# Patient Record
Sex: Male | Born: 1970
Health system: Southern US, Community
[De-identification: ages and names within clinical notes are randomized; demographics above are authoritative.]

## PROBLEM LIST (undated history)

## (undated) DIAGNOSIS — E785 Hyperlipidemia, unspecified: Secondary | ICD-10-CM

## (undated) DIAGNOSIS — E119 Type 2 diabetes mellitus without complications: Secondary | ICD-10-CM

## (undated) DIAGNOSIS — I10 Essential (primary) hypertension: Secondary | ICD-10-CM

## (undated) HISTORY — PX: OTHER SURGICAL HISTORY: SHX169

## (undated) HISTORY — DX: Type 2 diabetes mellitus without complications: E11.9

## (undated) HISTORY — PX: APPENDECTOMY: SHX54

## (undated) HISTORY — DX: Hyperlipidemia, unspecified: E78.5

## (undated) HISTORY — DX: Essential (primary) hypertension: I10

---

## 2008-04-11 ENCOUNTER — Encounter: Admission: RE | Admit: 2008-04-11 | Discharge: 2008-04-11 | Payer: Self-pay | Admitting: Specialist

## 2015-05-08 DIAGNOSIS — K429 Umbilical hernia without obstruction or gangrene: Secondary | ICD-10-CM | POA: Insufficient documentation

## 2015-05-08 DIAGNOSIS — E119 Type 2 diabetes mellitus without complications: Secondary | ICD-10-CM | POA: Insufficient documentation

## 2015-05-08 DIAGNOSIS — E785 Hyperlipidemia, unspecified: Secondary | ICD-10-CM | POA: Insufficient documentation

## 2017-02-04 DIAGNOSIS — M722 Plantar fascial fibromatosis: Secondary | ICD-10-CM | POA: Insufficient documentation

## 2017-02-16 ENCOUNTER — Encounter: Payer: Self-pay | Admitting: Nurse Practitioner

## 2017-02-16 ENCOUNTER — Ambulatory Visit: Payer: Self-pay | Attending: Nurse Practitioner | Admitting: Nurse Practitioner

## 2017-02-16 VITALS — BP 95/64 | HR 103 | Temp 98.2°F | Ht 67.0 in | Wt 220.0 lb

## 2017-02-16 DIAGNOSIS — Z87442 Personal history of urinary calculi: Secondary | ICD-10-CM | POA: Insufficient documentation

## 2017-02-16 DIAGNOSIS — Z7984 Long term (current) use of oral hypoglycemic drugs: Secondary | ICD-10-CM | POA: Insufficient documentation

## 2017-02-16 DIAGNOSIS — Z79899 Other long term (current) drug therapy: Secondary | ICD-10-CM | POA: Insufficient documentation

## 2017-02-16 DIAGNOSIS — I10 Essential (primary) hypertension: Secondary | ICD-10-CM

## 2017-02-16 DIAGNOSIS — E785 Hyperlipidemia, unspecified: Secondary | ICD-10-CM

## 2017-02-16 DIAGNOSIS — M79672 Pain in left foot: Secondary | ICD-10-CM

## 2017-02-16 DIAGNOSIS — E1169 Type 2 diabetes mellitus with other specified complication: Secondary | ICD-10-CM

## 2017-02-16 DIAGNOSIS — Z8249 Family history of ischemic heart disease and other diseases of the circulatory system: Secondary | ICD-10-CM | POA: Insufficient documentation

## 2017-02-16 DIAGNOSIS — Z9889 Other specified postprocedural states: Secondary | ICD-10-CM | POA: Insufficient documentation

## 2017-02-16 DIAGNOSIS — E118 Type 2 diabetes mellitus with unspecified complications: Secondary | ICD-10-CM

## 2017-02-16 DIAGNOSIS — Z833 Family history of diabetes mellitus: Secondary | ICD-10-CM | POA: Insufficient documentation

## 2017-02-16 LAB — GLUCOSE, POCT (MANUAL RESULT ENTRY): POC GLUCOSE: 300 mg/dL — AB (ref 70–99)

## 2017-02-16 MED ORDER — LISINOPRIL 20 MG PO TABS: 20.0000 mg | ORAL_TABLET | Freq: Every day | ORAL | 0 refills | Status: DC

## 2017-02-16 MED ORDER — SIMVASTATIN 40 MG PO TABS: 40.0000 mg | ORAL_TABLET | Freq: Every day | ORAL | 0 refills | Status: DC

## 2017-02-16 MED ORDER — TRUEPLUS LANCETS 28G MISC: 12 refills | Status: DC

## 2017-02-16 MED ORDER — GLIMEPIRIDE 4 MG PO TABS: 4.0000 mg | ORAL_TABLET | Freq: Two times a day (BID) | ORAL | 2 refills | Status: DC

## 2017-02-16 MED ORDER — CANAGLIFLOZIN 100 MG PO TABS: 100.0000 mg | ORAL_TABLET | Freq: Every day | ORAL | 1 refills | Status: AC

## 2017-02-16 MED ORDER — DAPAGLIFLOZIN PROPANEDIOL 5 MG PO TABS: 5.0000 mg | ORAL_TABLET | Freq: Two times a day (BID) | ORAL | 2 refills | Status: DC

## 2017-02-16 MED ORDER — SITAGLIPTIN PHOS-METFORMIN HCL 50-1000 MG PO TABS: 1.0000 | ORAL_TABLET | Freq: Two times a day (BID) | ORAL | 2 refills | Status: DC

## 2017-02-16 MED ORDER — TRUE METRIX METER W/DEVICE KIT: PACK | 0 refills | Status: DC

## 2017-02-16 MED ORDER — GLUCOSE BLOOD VI STRP: ORAL_STRIP | 12 refills | Status: DC

## 2017-02-16 MED FILL — JANUMET 50-1,000 MG TABLET: 50-1000 | 30 days supply | Qty: 60 | Fill #0

## 2017-02-16 MED FILL — TRUEplus LANCETS 28G MISC: 25 days supply | Qty: 100 | Fill #0

## 2017-02-16 MED FILL — LISINOPRIL 20 MG TAB: 20 | 30 days supply | Qty: 30 | Fill #0

## 2017-02-16 MED FILL — !TRUE METRIX BLOOD GLUCOSE: 365 days supply | Qty: 1 | Fill #0

## 2017-02-16 MED FILL — ?GLIMEPIRIDE 4 MG TABLET: 4 | 30 days supply | Qty: 60 | Fill #0

## 2017-02-16 MED FILL — INVOKANA 100 MG TABLET: 100 | 30 days supply | Qty: 30 | Fill #0

## 2017-02-16 MED FILL — SIMVASTATIN 40 MG TABLET: 40 | 30 days supply | Qty: 30 | Fill #0

## 2017-02-16 MED FILL — TRUE METRIX TEST STRIP: 25 days supply | Qty: 100 | Fill #0

## 2017-02-16 NOTE — Patient Instructions (Addendum)
Control del nivel sanguneo de glucosa en los adultos (Blood Glucose Monitoring, Adult) El control del nivel de azcar (glucosa) en la sangre lo ayuda a tener la diabetes bajo control. Tambin ayuda a que usted y el mdico controlen si el tratamiento de la diabetes es Armed forces logistics/support/administrative officer. El control del nivel sanguneo de glucosa implica realizar controles regulares como lo indique el mdico y Catering manager registro de los resultados (registro diario). POR QU DEBO [place] DE GLUCOSA? Si controla su nivel sanguneo de glucosa con regularidad, podr:  Comprender de [employer], la actividad fsica, las enfermedades y los medicamentos inciden en los niveles sanguneos de [place].  Conocer el nivel sanguneo de glucosa en cualquier momento dado. Saber rpidamente si el nivel es bajo (hipoglucemia) o alto (hiperglucemia).  Puede ser de ayuda para que usted y el mdico sepan cmo ajustar los medicamentos. [place] DE GLUCOSA? Siga las indicaciones del mdico acerca de la frecuencia con la que debe controlar el nivel sanguneo de glucosa. La frecuencia puede depender de:  El tipo de diabetes que tenga.  Si su diabetes est bajo control.  Los medicamentos que toma. Si usted tiene diabetes tipo 1:  Controle su nivel sanguneo de glucosa al [employer] al da.  Tambin controle su nivel sanguneo de glucosa: ? Antes de cada inyeccin de insulina. ? Antes y despus de hacer ejercicio. ? [place] comidas. ? Dos horas despus de una comida. ? Ocasionalmente, entre las 2:00a.m. y las 3:00a.m., como se lo hayan indicado. ? Antes de Optometrist tareas peligrosas, English as a second language teacher o usar maquinaria pesada. ? A la hora de acostarse.  Es posible que Geophysicist/field seismologist con ms frecuencia los niveles sanguneos de [place], [place] 6 a 10veces por da: ? Si [place] una bomba de [place]. ? Si necesita varias inyecciones diarias. ? Si su diabetes no est bien  controlada. ? Si est enfermo. ? Si tiene antecedentes de hipoglucemia grave. ? Si tiene antecedentes de no darse cuenta cundo est bajando su nivel sanguneo de glucosa (hipoglucemia asintomtica). Si usted tiene diabetes tipo 2:  Si recibe insulina u otro medicamento para la diabetes, controle el nivel sanguneo de glucosa al [employer] veces al da.  Mientras reciba tratamiento intensivo con insulina, debe medirse el nivel sanguneo de glucosa al menos 4veces al [employer]. Ocasionalmente, es posible que deba controlarse entre las 2:00a.m. y las 3:00a.m., segn se lo indiquen.  Tambin controle su nivel sanguneo de glucosa: ? Antes y despus de hacer ejercicio. ? Antes de Optometrist tareas peligrosas, English as a second language teacher o usar maquinaria pesada.  Es posible que Geophysicist/field seismologist con ms frecuencia los niveles sanguneos de glucosa si: ? Es necesario ajustar la dosis de sus medicamentos. ? Su diabetes no est bien controlada. ? Est enfermo. QU ES UN REGISTRO [place] DE GLUCOSA?  Un registro diario es un registro de los valores de glucosa en la [place]. Puede ayudarles a usted y a su mdico a: ? Health visitor en su nivel sanguneo de glucosa durante el transcurso del [place]. ? Ajustar su plan de control de la diabetes como sea necesario.  Cada vez que controle su nivel sanguneo de glucosa, anote el resultado y aquellos factores que pueden estar afectando su nivel sanguneo de glucosa, como la dieta y la actividad fsica Designer, multimedia.  La [employer] de los medidores de glucosa guardan un registro de las lecturas realizadas con el medidor. Algunos permiten descargar sus registros en  una computadora.  CMO ME CONTROLO EL NIVEL SANGUNEO DE GLUCOSA? Siga los siguientes pasos para obtener lecturas precisas de su glucemia: Materiales necesarios  Medidor de Horticulturist, commercial.  Tiras reactivas para el medidor. Cada medidor tiene sus propias tiras reactivas. Debe usar  las tiras reactivas que trae su medidor.  Una aguja para pincharse el dedo (lanceta). No utilice la misma lanceta en ms de una ocasin.  Un dispositivo que sujeta la lanceta (dispositivo de puncin).  Un diario o libro de anotaciones para [employer]. Procedimiento  [employer] con agua y [place].  Pnchese el costado del dedo (no la punta) con Optometrist. Use un dedo diferente cada vez.  Frote suavemente el dedo [employer] aparezca una pequea gota de [place].  Siga las instrucciones que vienen con el medidor para Public affairs consultant tira Firefighter, Contractor la sangre sobre la tira y usar el medidor de Horticulturist, commercial.  Registre el resultado y las observaciones que desee. Zonas del cuerpo alternativas para realizar las pruebas  Algunos medidores le permiten tomar sangre para la prueba de otras zonas del cuerpo que no son el dedo (zonas alternativas).  Si cree que tiene hipoglucemia o si tiene hipoglucemia asintomtica, no utilice las zonas alternativas del cuerpo. En su lugar, use los dedos.  Es posible que las zonas alternativas no sean tan precisas como los dedos porque el flujo de sangre es ms lento en esas zonas. Esto significa que el resultado que obtiene de estas zonas puede estar retrasado y ser un poco diferente del resultado que obtendra del dedo.  Los sitios alternativos ms comunes son los siguientes: ? Los antebrazos. ? Los muslos. ? La palma de [employer]. Consejos adicionales  Siempre tenga los insumos a mano.  Todos los medidores de glucosa incluyen un nmero de telfono "directo", disponible las 24 horas, al que podr llamar si tiene preguntas o [place]. Tambin puede consultar a su mdico.  Despus de usar algunas cajas de tiras reactivas, ajuste (calibre) el medidor de glucemia segn las instrucciones del medidor. Esta informacin no tiene Theme park manager el consejo del mdico. Asegrese de hacerle al mdico cualquier pregunta que  tenga. Document Released: 01/26/2005 Document Revised: 05/20/2015 Document Reviewed: 07/08/2015 Elsevier Interactive Patient Education  2017 [employer].  Dolor del pie (Foot Pain) El dolor del pie puede tener muchas causas. Algunas causas frecuentes son las siguientes:  Una lesin.  Un esguince.  Artritis.  Ampollas.  Juanetes. INSTRUCCIONES PARA EL CUIDADO EN EL HOGAR Est atento a cualquier cambio en los sntomas. Tome estas medidas para aliviar las molestias:  Si se lo indican, aplique hielo sobre la zona afectada: ? Ponga el hielo en una bolsa plstica. ? Coloque una [employer] piel y la bolsa de hielo. ? Deje el hielo durante 15 a 20 minutos, 3 a 4veces por da, durante 2das.  Tome los medicamentos de venta libre y los recetados solamente como se lo haya indicado el mdico.  Use zapatos cmodos y anatmicos que tengan buen calce. No use zapatos con tacones altos.  No permanezca de pie ni camine durante largos perodos.  No levante mucho peso. Esto puede agregar presin en el pie.  Haga ejercicios de estiramiento para Engineer, materials del pie y la rigidez como se lo haya indicado el mdico.  Hgase masajes suaves en el pie.  Mantenga los pies, limpios y secos. SOLICITE ATENCIN MDICA SI:  El dolor no mejora despus de [address] de  cuidados personales.  El dolor [place].  No puede apoyar el peso en el pie. SOLICITE ATENCIN MDICA DE INMEDIATO SI:  El pie se le adormece o tiene hormigueo.  El pie o los dedos de ese pie se le hinchan.  El pie o los dedos de ese pie se tornan de color blanco o [place].  Tiene el pie enrojecido y caliente al tacto. Esta informacin no tiene Theme park manager el consejo del mdico. Asegrese de hacerle al mdico cualquier pregunta que tenga. Document Released: 05/20/2015 Document Revised: 05/20/2015 Document Reviewed: 02/21/2014 Elsevier Interactive Patient Education  2018 [employer].  Espoln calcneo (Heel  [place]) Los espolones calcneos son protuberancias seas que se forman en la parte inferior del hueso del taln (calcneo). Son frecuentes y no siempre Customer service manager. Sin embargo, a menudo Photographer en la banda de tejido resistente que se extiende por debajo del hueso del pie (fascia plantar). Cuando esto sucede, puede sentir [employer] parte inferior del pie, cerca del taln. CAUSAS No se conoce con exactitud la causa de los espolones calcneos. Pueden deberse a la presin ejercida sobre el taln, o bien originarse en las inserciones musculares (tendones) cercanas que traccionan en el taln. FACTORES DE RIESGO Puede estar en riesgo de tener un espoln calcneo si:  Tiene ms de 40 aos.  Tiene sobrepeso.  Padece artritis por uso y desgaste (artrosis).  Presenta inflamacin en la fascia plantar. SIGNOS Y SNTOMAS Algunas personas tienen espolones calcneos aunque no presentan sntomas. Si tiene sntomas, estos pueden incluir los siguientes:  Dolor en la parte inferior del taln.  Dolor que empeora al levantarse de la cama por primera vez.  Dolor que empeora despus de caminar o ponerse de pie. DIAGNSTICO El mdico podr hacer el diagnstico del espoln calcneo en funcin de sus sntomas y del examen fsico. Tambin es posible que le realicen una radiografa del pie para detectar la presencia de una protuberancia sea que proviene del calcneo. TRATAMIENTO El tratamiento pretende aliviar el dolor del espoln calcneo. Este puede incluir lo siguiente:  Ejercicios de Film/video editor.  Bajar de [place].  Usar calzado, plantillas o aparatos ortopdicos especficos para mayor comodidad y apoyo.  Usar frulas a la noche para Secretary/administrator en una posicin apropiada.  Tomar medicamentos de venta libre para Engineer, materials.  Recibir tratamiento con ondas sonoras de alta intensidad para fragmentar el espoln (terapia extracorprea por ondas de choque).  Recibir inyecciones de  corticoides en el taln para reducir la hinchazn y Engineer, materials.  Someterse a Transport planner calcneo causa dolor a Air cabin crew (crnico). INSTRUCCIONES PARA EL CUIDADO EN EL HOGAR  Tome los medicamentos solamente como se lo haya indicado el mdico.  Pregunte a su mdico si debe aplicar hielo o compresas fras en las zonas dolorosas del taln o del pie.  Evite las actividades que puedan causarle dolor hasta que se recupere o como se lo haya indicado el mdico.  [place] antes de hacer ejercicio o actividad fsica.  Use zapatos con un buen apoyo que le calcen bien como le haya indicado su mdico. Es posible que necesite comprar zapatos nuevos. Si [place] zapatos viejos o que no le calzan bien no obtendr el apoyo que necesita.  Baje de peso si su mdico considera que debera [place]. Esto podra aliviar la presin en el pie que puede estar causando el dolor y las [place]. SOLICITE ATENCIN MDICA SI:  El dolor contina o empeora. Esta informacin no tiene como fin  reemplazar el consejo del mdico. Asegrese de hacerle al mdico cualquier pregunta que tenga. Document Released: 11/12/2005 Document Revised: 02/16/2014 Document Reviewed: 03/29/2013 Elsevier Interactive Patient Education  [employer]2018 Elsevier Inc.

## 2017-02-16 NOTE — Progress Notes (Signed)
Assessment & Plan:  Lance Howell was seen today for new patient (initial visit).  Diagnoses and all orders for this visit:  Essential hypertension -     lisinopril (PRINIVIL,ZESTRIL) 20 MG tablet; Take 1 tablet (20 mg total) by mouth daily. Continue all antihypertensives as prescribed.  Remember to bring in your blood pressure log with you for your follow up appointment.  DASH/Mediterranean Diets are healthier choices for HTN.    Pain of left heel -     Ambulatory referral to Podiatry Ace wrap foot Apply ice to heel for pain May alternate ibuprofen and acetominophen as instructed for pain Rest foot as needed  Controlled type 2 diabetes mellitus with complication, without long-term current use of insulin (HCC) -     Glucose (CBG) -     sitaGLIPtin-metformin (JANUMET) 50-1000 MG tablet; Take 1 tablet by mouth 2 (two) times daily with a meal. -     glimepiride (AMARYL) 4 MG tablet; Take 1 tablet (4 mg total) by mouth 2 (two) times daily. -     glucose blood test strip; Use as instructed -     Blood Glucose Monitoring Suppl (TRUE METRIX METER) w/Device KIT; Use as instructed. -     TRUEPLUS LANCETS 28G MISC; Use as instructed -     canagliflozin (INVOKANA) 100 MG TABS tablet; Take 1 tablet (100 mg total) by mouth daily Advised patient to keep a fasting blood sugar log fast, 2 hours post lunch and bedtime which will be reviewed at the next office visit.  Hyperlipidemia associated with type 2 diabetes mellitus (HCC) -     simvastatin (ZOCOR) 40 MG tablet; Take 1 tablet (40 mg total) by mouth daily. Work on a low fat, heart healthy diet and participate in regular aerobic exercise program to control as well.    Patient has been counseled on age-appropriate routine health concerns for screening and prevention. These are reviewed and up-to-date. Referrals have been placed accordingly. Immunizations are up-to-date or declined.    Subjective:   Chief Complaint  Patient presents with  . New  Patient (Initial Visit)    Patient stated he is here to establish care for his diabetes. Patient stated he have samples of his medication and would like to get them refills.    HPI  VRI was used to communicate directly with patient for the entire encounter including providing detailed patient instructions.  Lance Howell 47 y.o. male presents to office today to establish care as a new patient.   DM TYPE 2 Diagnosed 7-8 years ago. Checking fasting blood sugars once every morning. Lowest 86 Highest reading 201. He has his glucometer with him today. He denies any hypo of hyper glycemic symptoms. He is currently taking farxiga; will switch to invokana due to cost.    Left Heel Pain Onset 3 weeks ago. He denies any injury or trauma to left foot. Aggravating factors; Initial standing. Relieving factors: pain goes away after a few minutes of walking or standing. He has not tried anything for the pain.   Essential Hypertension Takes Lisinopril for HTN. He has a blood pressure monitor at home but reports it will only read as high low or normal. It will not give actual numbers. States his readings always say normal. Today his blood pressure is on the low end of normal. He is asymptomatic. May need to decrease lisinopril. Will continue to monitor. Denies chest pain, shortness of breath, palpitations, lightheadedness, dizziness, headaches or BLE edema.  BP Readings from Last  3 Encounters:  02/16/17 95/64     Review of Systems  Constitutional: Negative for fever, malaise/fatigue and weight loss.  HENT: Negative.  Negative for nosebleeds.   Eyes: Negative.  Negative for blurred vision, double vision and photophobia.  Respiratory: Negative.  Negative for cough and shortness of breath.   Cardiovascular: Negative.  Negative for chest pain, palpitations and leg swelling.  Gastrointestinal: Negative.  Negative for abdominal pain, constipation, diarrhea, heartburn, nausea and vomiting.  Musculoskeletal:  Negative.  Negative for myalgias.       Left heel pain  Neurological: Negative.  Negative for dizziness, focal weakness, seizures and headaches.  Endo/Heme/Allergies: Negative for environmental allergies.  Psychiatric/Behavioral: Negative.  Negative for suicidal ideas.    Past Medical History:  Diagnosis Date  . Diabetes mellitus without complication [facility])     Past Surgical History:  Procedure Laterality Date  . APPENDECTOMY    . kidney stone removal      Family History  Problem Relation Age of Onset  . Hypertension Mother   . Diabetes Father     Social History Reviewed with no changes to be made today.   Outpatient Medications Prior to Visit  Medication Sig Dispense Refill  . dapagliflozin propanediol (FARXIGA) 5 MG TABS tablet Take 5 mg by mouth 2 (two) times daily.    Marland Kitchen glimepiride (AMARYL) 4 MG tablet Take 4 mg by mouth 2 (two) times daily.    Marland Kitchen lisinopril (PRINIVIL,ZESTRIL) 20 MG tablet Take 20 mg by mouth daily.    . simvastatin (ZOCOR) 40 MG tablet Take 40 mg by mouth daily.    . sitaGLIPtin-metformin (JANUMET) 50-1000 MG tablet Take 1 tablet by mouth 2 (two) times daily with a meal.     No facility-administered medications prior to visit.     Not on File     Objective:    BP 95/64 (BP Location: Left Arm, Patient Position: Sitting, Cuff Size: Normal)   Pulse (!) 103   Temp 98.2 F (36.8 C) (Oral)   Ht 5' 7"  (1.702 m)   Wt 220 lb (99.8 kg)   SpO2 95%   BMI 34.46 kg/m  Wt Readings from Last 3 Encounters:  02/16/17 220 lb (99.8 kg)    Physical Exam  Constitutional: He is oriented to person, place, and time. He appears well-developed and well-nourished. He is cooperative.  HENT:  Head: Normocephalic and atraumatic.  Eyes: EOM are normal.  Neck: Normal range of motion.  Cardiovascular: Normal rate, regular rhythm, normal heart sounds and intact distal pulses. Exam reveals no gallop and no friction rub.  No murmur heard. Pulmonary/Chest: Effort normal  and breath sounds normal. No tachypnea. No respiratory distress. He has no decreased breath sounds. He has no wheezes. He has no rhonchi. He has no rales. He exhibits no tenderness.  Abdominal: Soft. Bowel sounds are normal.  Musculoskeletal: Normal range of motion. He exhibits no edema.       Left foot: There is tenderness.       Feet:  Neurological: He is alert and oriented to person, place, and time. Coordination normal.  Skin: Skin is warm and dry.  Psychiatric: He has a normal mood and affect. His behavior is normal. Judgment and thought content normal.  Nursing note and vitals reviewed.      Patient has been counseled extensively about nutrition and exercise as well as the importance of adherence with medications and regular follow-up. The patient was given clear instructions to go to ER or return to  medical center if symptoms don't improve, worsen or new problems develop. The patient verbalized understanding.   Follow-up: Return in about 2 months (around 04/19/2017) for Needs appointment with financial representative., HTN/HPL/DM.   Gildardo Pounds, FNP-BC [facility] and [place], [place]   02/16/2017, 10:41 PM

## 2017-02-19 ENCOUNTER — Ambulatory Visit: Payer: Self-pay | Attending: Nurse Practitioner

## 2017-03-22 ENCOUNTER — Ambulatory Visit: Payer: Self-pay | Attending: Nurse Practitioner

## 2017-03-22 MED FILL — ?SIMVASTATIN 40 MG TABS: 40 | 30 days supply | Qty: 30 | Fill #1

## 2017-03-22 MED FILL — ?GLIMEPIRIDE 4 MG TABLET: 4 | 30 days supply | Qty: 60 | Fill #1

## 2017-03-22 MED FILL — !JANUMET 50-1000MG TABLET: 50-1000 | 30 days supply | Qty: 60 | Fill #1

## 2017-03-22 MED FILL — INVOKANA 100 MG TABLET: 100 | 30 days supply | Qty: 30 | Fill #1

## 2017-03-22 MED FILL — TRUE METRIX TEST STRIP: 25 days supply | Qty: 100 | Fill #1

## 2017-03-22 MED FILL — TRUEplus LANCETS 28G MISC: 25 days supply | Qty: 100 | Fill #1

## 2017-03-22 MED FILL — LISINOPRIL 20 MG TAB: 20 | 30 days supply | Qty: 30 | Fill #1

## 2017-04-02 ENCOUNTER — Other Ambulatory Visit: Payer: Self-pay | Admitting: Nurse Practitioner

## 2017-04-02 ENCOUNTER — Ambulatory Visit: Payer: Self-pay | Attending: Nurse Practitioner

## 2017-04-02 DIAGNOSIS — Z0000 Encounter for general adult medical examination without abnormal findings: Secondary | ICD-10-CM | POA: Insufficient documentation

## 2017-04-02 NOTE — Progress Notes (Signed)
Patient here for lab visit only 

## 2017-04-03 LAB — LIPID PANEL
Chol/HDL Ratio: 3.1 ratio (ref 0.0–5.0)
Cholesterol, Total: 159 mg/dL (ref 100–199)
HDL: 52 mg/dL (ref >39)
LDL Calculated: 80 mg/dL (ref 0–99)
Triglycerides: 134 mg/dL (ref 0–149)
VLDL Cholesterol Cal: 27 mg/dL (ref 5–40)

## 2017-04-03 LAB — BASIC METABOLIC PANEL
BUN/Creatinine Ratio: 18 (ref 9–20)
BUN: 18 mg/dL (ref 6–24)
CALCIUM: 9.4 mg/dL (ref 8.7–10.2)
CHLORIDE: 101 mmol/L (ref 96–106)
CO2: 24 mmol/L (ref 20–29)
CREATININE: 1.02 mg/dL (ref 0.76–1.27)
GFR, EST AFRICAN AMERICAN: 102 mL/min/{1.73_m2} (ref >59)
GFR, EST NON AFRICAN AMERICAN: 88 mL/min/{1.73_m2} (ref >59)
Glucose: 135 mg/dL — ABNORMAL HIGH (ref 65–99)
Potassium: 4.8 mmol/L (ref 3.5–5.2)
Sodium: 143 mmol/L (ref 134–144)

## 2017-04-03 LAB — CBC
HEMOGLOBIN: 17.2 g/dL (ref 13.0–17.7)
Hematocrit: 50.9 % (ref 37.5–51.0)
MCH: 29.3 pg (ref 26.6–33.0)
MCHC: 33.8 g/dL (ref 31.5–35.7)
MCV: 87 fL (ref 79–97)
Platelets: 267 10*3/uL (ref 150–379)
RBC: 5.87 x10E6/uL — AB (ref 4.14–5.80)
RDW: 14 % (ref 12.3–15.4)
WBC: 10.9 10*3/uL — AB (ref 3.4–10.8)

## 2017-04-03 LAB — HEMOGLOBIN A1C
ESTIMATED AVERAGE GLUCOSE: 169 mg/dL
HEMOGLOBIN A1C: 7.5 % — AB (ref 4.8–5.6)

## 2017-04-13 ENCOUNTER — Telehealth: Payer: Self-pay

## 2017-04-13 NOTE — Telephone Encounter (Signed)
CMA called patient to inform on lab results and PCP advising.   Patient understood. No questions.  Spanish Interpreter Angelina PihJoanni [phone] assist with the call.

## 2017-04-13 NOTE — Telephone Encounter (Signed)
-----   Message from Claiborne RiggZelda W Fleming, NP sent at 04/11/2017  7:10 PM EST ----- A1c is 7.5 down from 7.8 when your a1c was 7.8  The goal is less than 7. Cholesterol levels are normal.. Work on a low fat, heart healthy diet and participate in regular aerobic exercise program to control as well by working out at least 150 minutes per week. No fried foods. No junk foods, sodas, sugary drinks, unhealthy snacking, or smoking.

## 2017-04-16 ENCOUNTER — Ambulatory Visit: Payer: Self-pay | Admitting: Nurse Practitioner

## 2017-04-21 ENCOUNTER — Ambulatory Visit: Payer: Self-pay | Admitting: Nurse Practitioner

## 2017-05-05 ENCOUNTER — Ambulatory Visit: Payer: Self-pay | Attending: Nurse Practitioner | Admitting: Nurse Practitioner

## 2017-05-05 ENCOUNTER — Encounter: Payer: Self-pay | Admitting: Nurse Practitioner

## 2017-05-05 VITALS — BP 120/85 | HR 81 | Temp 98.2°F | Ht 67.0 in | Wt 221.8 lb

## 2017-05-05 DIAGNOSIS — E1169 Type 2 diabetes mellitus with other specified complication: Secondary | ICD-10-CM | POA: Insufficient documentation

## 2017-05-05 DIAGNOSIS — K429 Umbilical hernia without obstruction or gangrene: Secondary | ICD-10-CM | POA: Insufficient documentation

## 2017-05-05 DIAGNOSIS — E1165 Type 2 diabetes mellitus with hyperglycemia: Secondary | ICD-10-CM | POA: Insufficient documentation

## 2017-05-05 DIAGNOSIS — E785 Hyperlipidemia, unspecified: Secondary | ICD-10-CM

## 2017-05-05 DIAGNOSIS — Z7984 Long term (current) use of oral hypoglycemic drugs: Secondary | ICD-10-CM | POA: Insufficient documentation

## 2017-05-05 DIAGNOSIS — E11649 Type 2 diabetes mellitus with hypoglycemia without coma: Secondary | ICD-10-CM | POA: Insufficient documentation

## 2017-05-05 DIAGNOSIS — Z79899 Other long term (current) drug therapy: Secondary | ICD-10-CM | POA: Insufficient documentation

## 2017-05-05 DIAGNOSIS — Z09 Encounter for follow-up examination after completed treatment for conditions other than malignant neoplasm: Secondary | ICD-10-CM | POA: Insufficient documentation

## 2017-05-05 DIAGNOSIS — Z833 Family history of diabetes mellitus: Secondary | ICD-10-CM | POA: Insufficient documentation

## 2017-05-05 DIAGNOSIS — E118 Type 2 diabetes mellitus with unspecified complications: Secondary | ICD-10-CM

## 2017-05-05 DIAGNOSIS — Z9889 Other specified postprocedural states: Secondary | ICD-10-CM | POA: Insufficient documentation

## 2017-05-05 DIAGNOSIS — Z760 Encounter for issue of repeat prescription: Secondary | ICD-10-CM | POA: Insufficient documentation

## 2017-05-05 DIAGNOSIS — I10 Essential (primary) hypertension: Secondary | ICD-10-CM | POA: Insufficient documentation

## 2017-05-05 DIAGNOSIS — Z8249 Family history of ischemic heart disease and other diseases of the circulatory system: Secondary | ICD-10-CM | POA: Insufficient documentation

## 2017-05-05 DIAGNOSIS — K6289 Other specified diseases of anus and rectum: Secondary | ICD-10-CM | POA: Insufficient documentation

## 2017-05-05 LAB — GLUCOSE, POCT (MANUAL RESULT ENTRY): POC Glucose: 173 mg/dl — AB (ref 70–99)

## 2017-05-05 MED ORDER — GLIMEPIRIDE 4 MG PO TABS: 4.0000 mg | ORAL_TABLET | Freq: Every day | ORAL | 1 refills | Status: DC

## 2017-05-05 MED ORDER — CANAGLIFLOZIN 100 MG PO TABS: 100.0000 mg | ORAL_TABLET | Freq: Every day | ORAL | 1 refills | Status: DC

## 2017-05-05 MED ORDER — SIMVASTATIN 40 MG PO TABS: 40.0000 mg | ORAL_TABLET | Freq: Every day | ORAL | 1 refills | Status: DC

## 2017-05-05 MED ORDER — SITAGLIPTIN PHOS-METFORMIN HCL 50-1000 MG PO TABS: 1.0000 | ORAL_TABLET | Freq: Two times a day (BID) | ORAL | 6 refills | Status: DC

## 2017-05-05 MED ORDER — LISINOPRIL 20 MG PO TABS: 20.0000 mg | ORAL_TABLET | Freq: Every day | ORAL | 1 refills | Status: DC

## 2017-05-05 MED ORDER — HYDROCORTISONE ACE-PRAMOXINE 1-1 % RE CREA: 1.0000 "application " | TOPICAL_CREAM | Freq: Two times a day (BID) | RECTAL | 0 refills | Status: DC

## 2017-05-05 MED FILL — HYDROCORT-PRAMOXINE 1%-1% C: 1-1 | 15 days supply | Qty: 30 | Fill #0

## 2017-05-05 MED FILL — ?GLIMEPIRIDE 4 MG TABLET: 4 | 30 days supply | Qty: 30 | Fill #0

## 2017-05-05 MED FILL — INVOKANA 100 MG TABLET: 100 | 30 days supply | Qty: 30 | Fill #0

## 2017-05-05 MED FILL — LISINOPRIL 20 MG TAB: 20 | 30 days supply | Qty: 30 | Fill #0

## 2017-05-05 MED FILL — ?SIMVASTATIN 40 MG TABS: 40 | 30 days supply | Qty: 30 | Fill #0

## 2017-05-05 MED FILL — !JANUMET 50-1000MG TABLET: 50-1000 | 30 days supply | Qty: 60 | Fill #0

## 2017-05-05 NOTE — Progress Notes (Signed)
Assessment & Plan:  Lance Howell was seen today for follow-up and medication refill.  Diagnoses and all orders for this visit:  Controlled type 2 diabetes mellitus with complication, without long-term current use of insulin (HCC) -     Glucose (CBG) -     canagliflozin (INVOKANA) 100 MG TABS tablet; Take 1 tablet (100 mg total) by mouth daily before breakfast. -     glimepiride (AMARYL) 4 MG tablet; Take 1 tablet (4 mg total) by mouth daily with breakfast. -     sitaGLIPtin-metformin (JANUMET) 50-1000 MG tablet; Take 1 tablet by mouth 2 (two) times daily with a meal.  Continue blood sugar control as discussed in office today, low carbohydrate diet, and regular physical exercise as tolerated, 150 minutes per week (30 min each day, 5 days per week, or 50 min 3 days per week). Keep blood sugar logs with fasting goal of 80-130 mg/dl, post prandial less than 180.  For Hypoglycemia: BS <60 and Hyperglycemia BS >400; contact the clinic ASAP. Annual eye exams and foot exams are recommended.   Essential hypertension -     lisinopril (PRINIVIL,ZESTRIL) 20 MG tablet; Take 1 tablet (20 mg total) by mouth daily. Continue all antihypertensives as prescribed.  Remember to bring in your blood pressure log with you for your follow up appointment.  DASH/Mediterranean Diets are healthier choices for HTN.   Hyperlipidemia associated with type 2 diabetes mellitus (HCC) -     simvastatin (ZOCOR) 40 MG tablet; Take 1 tablet (40 mg total) by mouth daily. INSTRUCTIONS: Work on a low fat, heart healthy diet and participate in regular aerobic exercise program to control as well by working out at least 150 minutes per week. No fried foods. No junk foods, sodas, sugary drinks, unhealthy snacking, or smoking.   Umbilical hernia without obstruction and without gangrene -     US Abdomen Complete; Future  Rectal pain -     pramoxine-hydrocortisone (PROCTOCREAM-HC) 1-1 % rectal cream; Place 1 application rectally 2 (two)  times daily.    Patient has been counseled on age-appropriate routine health concerns for screening and prevention. These are reviewed and up-to-date. Referrals have been placed accordingly. Immunizations are up-to-date or declined.    Subjective:   Chief Complaint  Patient presents with  . Follow-up    Pt is here for a follow-up on Hypertension, cholesterol, and diabetes.   . Medication Refill    Pt. stated he need medication refills. He tried to get refill by the pharmacy through pass elgible but he said the Provider need to authorize it.   HPI Lance Howell 47 y.o. male presents to office today for follow up. VRI was used to communicate directly with patient for the entire encounter including providing detailed patient instructions.   DM TYPE 2 He recently ran out of his Invokana.  Currently taking glimepiride 4 mg daily and Janumet 50-1000 mg BID. Checking his blood sugar twice a day and he has his glucose log with him as well.  Seven-and Fourteen day average 130s; 30-day average 120s.  He denies any symptoms of hypo-or hyperglycemia.  He is overdue for an eye exam.    Lab Results  Component Value Date   HGBA1C 7.5 (H) 04/02/2017    Essential Hypertension He is medically compliant taking lisinopril 20 g daily.  Blood pressure is well controlled. Denies chest pain, shortness of breath, palpitations, lightheadedness, dizziness, headaches or BLE edema.  BP Readings from Last 3 Encounters:  05/05/17 120/85  02/16/17 95/64  Hyperlipidemia Patient presents for follow up to hyperlipidemia.  LDL at goal.  He is not medication compliant. He is diet compliant and denies chest pain, exertional chest pressure/discomfort and lower extremity edema or statin intolerance including myalgias.  Lab Results  Component Value Date   CHOL 159 04/02/2017   Lab Results  Component Value Date   HDL 52 04/02/2017   Lab Results  Component Value Date   LDLCALC 80 04/02/2017   Lab Results    Component Value Date   TRIG 134 04/02/2017   Lab Results  Component Value Date   CHOLHDL 3.1 04/02/2017    Review of Systems  Constitutional: Negative for fever, malaise/fatigue and weight loss.  HENT: Negative.  Negative for nosebleeds.   Eyes: Negative.  Negative for blurred vision, double vision and photophobia.  Respiratory: Negative.  Negative for cough and shortness of breath.   Cardiovascular: Negative.  Negative for chest pain, palpitations and leg swelling.  Gastrointestinal: Positive for abdominal pain and constipation. Negative for blood in stool, heartburn, melena, nausea and vomiting.       Hemorrhoids  Musculoskeletal: Negative.  Negative for myalgias.  Neurological: Negative.  Negative for dizziness, focal weakness, seizures and headaches.  Psychiatric/Behavioral: Negative.  Negative for suicidal ideas.    Past Medical History:  Diagnosis Date  . Diabetes mellitus without complication [facility])     Past Surgical History:  Procedure Laterality Date  . APPENDECTOMY    . kidney stone removal      Family History  Problem Relation Age of Onset  . Hypertension Mother   . Diabetes Father     Social History Reviewed with no changes to be made today.   Outpatient Medications Prior to Visit  Medication Sig Dispense Refill  . Blood Glucose Monitoring Suppl (TRUE METRIX METER) w/Device KIT Use as instructed. 1 kit 0  . glucose blood test strip Use as instructed 100 each 12  . TRUEPLUS LANCETS 28G MISC Use as instructed 100 each 12  . canagliflozin (INVOKANA) 100 MG TABS tablet Take by mouth daily before breakfast.    . glimepiride (AMARYL) 4 MG tablet Take 1 tablet (4 mg total) by mouth 2 (two) times daily. 60 tablet 2  . lisinopril (PRINIVIL,ZESTRIL) 20 MG tablet Take 1 tablet (20 mg total) by mouth daily. 90 tablet 0  . simvastatin (ZOCOR) 40 MG tablet Take 1 tablet (40 mg total) by mouth daily. 90 tablet 0  . sitaGLIPtin-metformin (JANUMET) 50-1000 MG tablet Take  1 tablet by mouth 2 (two) times daily with a meal. 60 tablet 2   No facility-administered medications prior to visit.     Not on File     Objective:    BP 120/85 (BP Location: Left Arm, Patient Position: Sitting, Cuff Size: Normal)   Pulse 81   Temp 98.2 F (36.8 C) (Oral)   Ht _0  (1.702 m)   Wt 221 lb 12.8 oz (100.6 kg)   SpO2 95%   BMI 34.74 kg/m  Wt Readings from Last 3 Encounters:  05/05/17 221 lb 12.8 oz (100.6 kg)  02/16/17 220 lb (99.8 kg)    Physical Exam  Constitutional: He is oriented to person, place, and time. He appears well-developed and well-nourished. He is cooperative.  HENT:  Head: Normocephalic and atraumatic.  Eyes: EOM are normal.  Neck: Normal range of motion.  Cardiovascular: Normal rate, regular rhythm and normal heart sounds. Exam reveals no gallop and no friction rub.  No murmur heard. Pulmonary/Chest: Effort normal and  breath sounds normal. No tachypnea. No respiratory distress. He has no decreased breath sounds. He has no wheezes. He has no rhonchi. He has no rales. He exhibits no tenderness.  Abdominal: Soft. Bowel sounds are normal. He exhibits mass. There is tenderness in the periumbilical area. A hernia is present. Hernia confirmed positive in the ventral area.    Musculoskeletal: Normal range of motion. He exhibits no edema.  Neurological: He is alert and oriented to person, place, and time. Coordination normal.  Skin: Skin is warm and dry.  Psychiatric: He has a normal mood and affect. His behavior is normal. Judgment and thought content normal.  Nursing note and vitals reviewed.      Patient has been counseled extensively about nutrition and exercise as well as the importance of adherence with medications and regular follow-up. The patient was given clear instructions to go to ER or return to medical center if symptoms don't improve, worsen or new problems develop. The patient verbalized understanding.   Follow-up: Return in about 3  months (around 08/05/2017) for DM.   Gildardo Pounds, FNP-BC [facility] [place], [place]   05/05/2017, 8:10 PM

## 2017-05-05 NOTE — Patient Instructions (Signed)
Hemorroides (Hemorrhoids) Las hemorroides son venas inflamadas adentro o alrededor del recto o del ano. Las hemorroides pueden causar dolor, picazn o hemorragias. Generalmente no causan problemas graves. Con frecuencia mejoran al cambiar la dieta, el estilo de vida y otros tratamientos en el hogar. CUIDADOS EN EL HOGAR Comida y bebida  Consuma alimentos que contengan fibra, como cereales integrales, porotos, frutos secos, frutas y verduras. Pregntele a su mdico acerca de tomar productos con fibra aadida en ellos (complementos defibra).  Beba suficiente lquido para mantener el pis (orina) claro o de color amarillo plido. En caso de dolor e hinchazn  Tome un bao de agua tibia (bao de asiento) durante 20 minutos para aliviar el dolor. Hgalo 3 o 4veces al da.  Si se lo indican, aplique hielo sobre la zona adolorida. Puede ser beneficioso aplicar hielo entre los baos con agua tibia. ? Ponga el hielo en una bolsa plstica. ? Coloque una toalla entre la piel y la bolsa de hielo. ? Coloque el hielo durante 20 minutos, 2 a 3 veces por da. Instrucciones generales  Tome los medicamentos de venta libre y los recetados solamente como se lo haya indicado el mdico. ? Las cremas y medicamentos recetados que se colocan en el ano (supositorios) se pueden usar o aplicar como se lo hayan indicado.  Haga ejercicio fsico con frecuencia.  Vaya al bao cuando sienta la necesidad de defecar. No espere.  Evite hacer demasiada fuerza al defecar.  Mantenga la zona del ano limpia y seca. Use papel higinico hmedo o toallas de papel hmedas.  No pase mucho tiempo sentado en el inodoro. SOLICITE AYUDA SI:  Tiene alguno de estos sntomas: ? Dolor e hinchazn que no mejoran con el tratamiento o los medicamentos. ? Hemorragia que no se detiene. ? Dificultad para defecar o imposibilidad de hacerlo. ? Dolor o hinchazn en la zona exterior de las hemorroides. Esta informacin no tiene como fin  reemplazar el consejo del mdico. Asegrese de hacerle al mdico cualquier pregunta que tenga. Document Released: 05/23/2012 Document Revised: 05/20/2015 Document Reviewed: 10/10/2014 Elsevier Interactive Patient Education  2018 Elsevier Inc.  

## 2017-05-10 ENCOUNTER — Ambulatory Visit (HOSPITAL_COMMUNITY)
Admission: RE | Admit: 2017-05-10 | Discharge: 2017-05-10 | Disposition: A | Discharge status: Home / Self Care | Payer: Self-pay | Source: Ambulatory Visit | Attending: Nurse Practitioner | Admitting: Nurse Practitioner

## 2017-05-10 ENCOUNTER — Other Ambulatory Visit: Payer: Self-pay | Admitting: Nurse Practitioner

## 2017-05-10 DIAGNOSIS — K429 Umbilical hernia without obstruction or gangrene: Secondary | ICD-10-CM

## 2017-05-11 ENCOUNTER — Telehealth: Payer: Self-pay

## 2017-05-11 NOTE — Telephone Encounter (Signed)
-----   Message from Guy Francoravia Benjamin, RN sent at 05/11/2017  4:16 PM EDT -----   ----- Message ----- From: Claiborne RiggFleming, Zelda W, NP Sent: 05/10/2017   5:16 PM To: Vidal SchwalbeBien Yy, CMA  A hernia was not visualized on the ultrasound. Please let me know if you are continuing to have pain in this area.

## 2017-05-11 NOTE — Telephone Encounter (Signed)
CMA call regarding US results   Patient did not answer wife did left a message with wife to call back to the clinic regarding results

## 2017-05-11 NOTE — Telephone Encounter (Signed)
Patient return CMA call  Patient was aware and understood  Patient stated that he still has pain in the area

## 2017-05-12 ENCOUNTER — Other Ambulatory Visit: Payer: Self-pay | Admitting: Nurse Practitioner

## 2017-05-12 DIAGNOSIS — K429 Umbilical hernia without obstruction or gangrene: Secondary | ICD-10-CM

## 2017-05-12 NOTE — Telephone Encounter (Signed)
Order has been placed.

## 2017-05-13 ENCOUNTER — Telehealth: Payer: Self-pay

## 2017-05-13 NOTE — Telephone Encounter (Signed)
CMA call back to get when his available for a CT scan   Patient did not answer but left a VM stating to call back

## 2017-05-14 ENCOUNTER — Telehealth: Payer: Self-pay

## 2017-05-14 NOTE — Telephone Encounter (Signed)
Patient called back regarding my VM   Patient stated that I can schedule his appointment any day for his CT scan

## 2017-05-14 NOTE — Telephone Encounter (Signed)
CMA call patient regarding his CT scan on 05/21/17 @ 10 am at [facility]   He has to pick up his contrast drink before the appointment   Patient did not answer but left a VM stating the appointment info

## 2017-05-21 ENCOUNTER — Ambulatory Visit (HOSPITAL_COMMUNITY)
Admission: RE | Admit: 2017-05-21 | Discharge: 2017-05-21 | Disposition: A | Discharge status: Home / Self Care | Payer: Self-pay | Source: Ambulatory Visit | Attending: Nurse Practitioner | Admitting: Nurse Practitioner

## 2017-05-21 DIAGNOSIS — K760 Fatty (change of) liver, not elsewhere classified: Secondary | ICD-10-CM | POA: Insufficient documentation

## 2017-05-21 DIAGNOSIS — K429 Umbilical hernia without obstruction or gangrene: Secondary | ICD-10-CM | POA: Insufficient documentation

## 2017-05-21 MED ORDER — IOPAMIDOL (ISOVUE-300) INJECTION 61%
INTRAVENOUS | Status: AC
  Filled 2017-05-21: qty 100

## 2017-05-21 MED ORDER — IOPAMIDOL (ISOVUE-300) INJECTION 61%
100.0000 mL | Freq: Once | INTRAVENOUS | Status: AC | PRN
  Administered 2017-05-21: 100 mL via INTRAVENOUS

## 2017-05-24 ENCOUNTER — Telehealth: Payer: Self-pay

## 2017-05-24 NOTE — Telephone Encounter (Signed)
CMA spoke to patient's wife (Emergency Contact).   Patient's wife is informed on CT scan results and stated the patient is constipated and last bowl movement was Saturday. Would like suggestion from PCP.  Patient's wife is also concerned if he do need any surgery for his hernia.

## 2017-05-24 NOTE — Telephone Encounter (Signed)
He can try Over the counter miralax one capful a day dissolved in water for his constipation. He needs to make sure he is drinking at least 48oz of water per day. Increase fruits and vegetables. He does not need surgery for his hernia at this time.

## 2017-05-24 NOTE — Telephone Encounter (Signed)
CMA spoke to patient's wife with her concern with PCP suggestion. Spanish interpreter Anyanna [phone] assist with the call.   Patient's wife understood, no questions/concerns.

## 2017-05-24 NOTE — Telephone Encounter (Signed)
-----   Message from Claiborne RiggZelda W Fleming, NP sent at 05/23/2017  8:07 PM EDT ----- CT of abdomen showing a hernia containing fat above your belly button. Also showing fatty liver which is likely related to your diet. INSTRUCTIONS: Work on a low fat, heart healthy diet and participate in regular aerobic exercise program by working out at least 150 minutes per week. No fried foods. No junk foods, sodas, sugary drinks, unhealthy snacking, alcohol or smoking.

## 2017-06-03 MED FILL — !JANUMET 50-1000MG TABLET: 50-1000 | 30 days supply | Qty: 60 | Fill #1

## 2017-06-03 MED FILL — ?GLIMEPIRIDE 4 MG TABLET: 4 | 30 days supply | Qty: 30 | Fill #1

## 2017-06-03 MED FILL — LISINOPRIL 20 MG TAB: 20 | 30 days supply | Qty: 30 | Fill #1

## 2017-06-03 MED FILL — ?SIMVASTATIN 40 MG TABS: 40 | 30 days supply | Qty: 30 | Fill #1

## 2017-06-03 MED FILL — INVOKANA 100 MG TABLET: 100 | 30 days supply | Qty: 30 | Fill #1

## 2017-07-06 MED FILL — LISINOPRIL 20 MG TAB: 20 | 30 days supply | Qty: 30 | Fill #2

## 2017-07-06 MED FILL — INVOKANA 100 MG TABLET: 100 | 30 days supply | Qty: 30 | Fill #2

## 2017-07-06 MED FILL — ?GLIMEPIRIDE 4 MG TABLET: 4 | 30 days supply | Qty: 30 | Fill #2

## 2017-07-06 MED FILL — ?SIMVASTATIN 40 MG TABS: 40 | 30 days supply | Qty: 30 | Fill #2

## 2017-07-06 MED FILL — !JANUMET 50-1000MG TABLET: 50-1000 | 30 days supply | Qty: 60 | Fill #2

## 2017-07-08 MED FILL — TRUE METRIX TEST STRIP: 25 days supply | Qty: 100 | Fill #2

## 2017-07-08 MED FILL — TRUEplus LANCETS 28G MISC: 25 days supply | Qty: 100 | Fill #2

## 2017-08-02 ENCOUNTER — Ambulatory Visit: Payer: Self-pay | Attending: Nurse Practitioner | Admitting: Nurse Practitioner

## 2017-08-02 ENCOUNTER — Encounter: Payer: Self-pay | Admitting: Nurse Practitioner

## 2017-08-02 VITALS — BP 126/73 | HR 78 | Temp 98.4°F | Ht 67.0 in | Wt 221.8 lb

## 2017-08-02 DIAGNOSIS — E785 Hyperlipidemia, unspecified: Secondary | ICD-10-CM | POA: Insufficient documentation

## 2017-08-02 DIAGNOSIS — Z7984 Long term (current) use of oral hypoglycemic drugs: Secondary | ICD-10-CM | POA: Insufficient documentation

## 2017-08-02 DIAGNOSIS — E118 Type 2 diabetes mellitus with unspecified complications: Secondary | ICD-10-CM

## 2017-08-02 DIAGNOSIS — Z8249 Family history of ischemic heart disease and other diseases of the circulatory system: Secondary | ICD-10-CM | POA: Insufficient documentation

## 2017-08-02 DIAGNOSIS — N529 Male erectile dysfunction, unspecified: Secondary | ICD-10-CM

## 2017-08-02 DIAGNOSIS — Z833 Family history of diabetes mellitus: Secondary | ICD-10-CM | POA: Insufficient documentation

## 2017-08-02 DIAGNOSIS — E1169 Type 2 diabetes mellitus with other specified complication: Secondary | ICD-10-CM | POA: Insufficient documentation

## 2017-08-02 DIAGNOSIS — N521 Erectile dysfunction due to diseases classified elsewhere: Secondary | ICD-10-CM | POA: Insufficient documentation

## 2017-08-02 DIAGNOSIS — Z79899 Other long term (current) drug therapy: Secondary | ICD-10-CM | POA: Insufficient documentation

## 2017-08-02 DIAGNOSIS — I10 Essential (primary) hypertension: Secondary | ICD-10-CM | POA: Insufficient documentation

## 2017-08-02 LAB — POCT GLYCOSYLATED HEMOGLOBIN (HGB A1C): Hemoglobin A1C: 7.7 % — AB (ref 4.0–5.6)

## 2017-08-02 LAB — GLUCOSE, POCT (MANUAL RESULT ENTRY): POC Glucose: 156 mg/dl — AB (ref 70–99)

## 2017-08-02 MED ORDER — SIMVASTATIN 40 MG PO TABS: 40.0000 mg | ORAL_TABLET | Freq: Every day | ORAL | 1 refills | Status: DC

## 2017-08-02 MED ORDER — GLIMEPIRIDE 4 MG PO TABS: 4.0000 mg | ORAL_TABLET | Freq: Every day | ORAL | 1 refills | Status: DC

## 2017-08-02 MED ORDER — CANAGLIFLOZIN 100 MG PO TABS: 100.0000 mg | ORAL_TABLET | Freq: Every day | ORAL | 1 refills | Status: AC

## 2017-08-02 MED ORDER — LISINOPRIL 20 MG PO TABS: 20.0000 mg | ORAL_TABLET | Freq: Every day | ORAL | 1 refills | Status: DC

## 2017-08-02 MED ORDER — SILDENAFIL CITRATE 100 MG PO TABS: 50.0000 mg | ORAL_TABLET | Freq: Every day | ORAL | 11 refills | Status: DC | PRN

## 2017-08-02 MED FILL — INVOKANA 100 MG TABLET: 100 | 30 days supply | Qty: 30 | Fill #0

## 2017-08-02 MED FILL — GLIMEPIRIDE 4 MG TABS: 4 | 30 days supply | Qty: 30 | Fill #0

## 2017-08-02 MED FILL — !VIAGRA 100 MG TABLET: 100 MG | 15 days supply | Qty: 5 | Fill #0

## 2017-08-02 MED FILL — ?SIMVASTATIN 40 MG TABS: 40 | 30 days supply | Qty: 30 | Fill #0

## 2017-08-02 MED FILL — LISINOPRIL 20 MG TAB: 20 | 30 days supply | Qty: 30 | Fill #0

## 2017-08-02 NOTE — Patient Instructions (Signed)
Disfuncin erctil (Erectile Dysfunction) La disfuncin erctil es la incapacidad de lograr una ereccin suficiente como para Winferd Humphreytener una relacin sexual. La disfuncin erctil implica:  Imposibilidad de Presenter, broadcastinglograr una ereccin.  Falta de rigidez suficiente que permita la penetracin.  Falta de ereccin antes de finalizar la relacin sexual.  Lawyeryaculacin precoz. CAUSAS  Ciertos medicamentos como: ? Analgsicos. ? Antihistamnicos. ? Antidepresivos. ? Medicamentos para la presin arterial. ? Uso de diurticos. ? Medicamentos para la lcera. ? Relajantes musculares. ? Drogas ilegales.  Consumo excesivo de alcohol.  Causas psicolgicas como: ? Ansiedad. ? Depresin. ? Tristeza. ? Agotamiento. ? Temor al desempeo. ? Estrs.  Causas fsicas como: ? Problemas arteriales. Aqu se incluyen la diabetes, el hbito de fumar, enfermedades hepticas o aterosclerosis. ? Hipertensin arterial. ? Problemas hormonales como en los casos de bajo nivel de Paw Pawtestosterona. ? Obesidad. ? Problemas neurolgicos. Se incluyen lesiones en la espalda o la pelvis, diabetes mellitus, esclerosis mltiple o enfermedad de Parkinson.  SNTOMAS  Imposibilidad de Presenter, broadcastinglograr una ereccin.  Falta de rigidez suficiente que permita la penetracin.  Falta de ereccin antes de finalizar la relacin sexual.  Lawyeryaculacin precoz.  A veces puede haber erecciones normales, pero con frecuencia son episodios insatisfactorios.  Puede ocurrir que los orgasmos no sean satisfactorios en sensacin o frecuencia.  [place] satisfaccin sexual en ambos miembros de la pareja debido a los problemas erctiles.  Con la ereccin el pene puede curvarse. Esto puede provocar dolor o la curvatura ser demasiado pronunciada como para permitir la relacin sexual.  No tener nunca erecciones nocturnas.  DIAGNSTICO El mdico podr hacer un diagnstico basndose en:  Un examen fsico para descartar otras enfermedades o problemas  especficos en el pene.  Preguntas detalladas acerca del problema.  Anlisis de sangre para descartar diabetes o para medir los niveles de hormonas.  Anlisis de orina para hallar enfermedades subyacentes.  Sherlyn LeesUna ecografa para descartar cicatrices.  Prueba de flujo sanguneo en el pene.  Estudio del sueo para medir las erecciones nocturnas. TRATAMIENTO  Medicamentos por va oral.  Medicamentos inyectables en el pene.  En algunos casos se aconseja una bomba de vaco con anillo  Ciruga de implante de pene. Pueden implantarle: ? Implante inflable. ? Implante semirgido.  Ciruga de vasos sanguneos.  INSTRUCCIONES PARA EL CUIDADO EN EL HOGAR  Si le indicaron medicamentos por va oral, debe tomarlos segn las indicaciones. No aumente la dosis sin comentarlo primero con su mdico.  Si [place] inyecciones que se aplica usted mismo, evite las venas que estn en la superficie del pene. Aplique presin en el sitio de la inyeccin durante 5 minutes.  Si [place] una bomba de vaco, asegrese de que ha [employer] las instrucciones antes de usarlas. Haga preguntas a su mdico antes de llevar la bomba a su casa.  SOLICITE ATENCIN MDICA SI:  Siente dolor que no responde a los Public affairs consultantanalgsicos que le recetaron.  Tiene nuseas o vmitos.  SOLICITE ATENCIN MDICA DE INMEDIATO SI:  Al administrarse los medicamentos orales o inyectables experimenta una ereccin que dura ms de 4 horas. Si el mdico no est disponible, concurra al servicio de emergencias ms cercano para Development worker, communityrealizar una evaluacin. Una ereccin que dure ms de 4 horas, puede dar como resultado un dao permanente en el pene.  Tiene un dolor intenso.  Siente dolor, u observa hinchazn o enrojecimiento en el pene.  Tiene una zona colorada que se extiende hacia la ingle o hacia la zona baja del abdomen.  No puede orinar.  Esta informacin no tiene  como fin reemplazar el consejo del mdico. Asegrese de hacerle al mdico cualquier pregunta  que tenga. Document Released: 01/26/2005 Document Revised: 09/28/2012 Document Reviewed: 06/30/2012 Elsevier Interactive Patient Education  2017 Elsevier Inc.  

## 2017-08-02 NOTE — Progress Notes (Signed)
Assessment & Plan:  Lance Howell was seen today for follow-up.  Diagnoses and all orders for this visit:  Controlled type 2 diabetes mellitus with complication, without long-term current use of insulin (HCC) -     Glucose (CBG) -     HgB A1c -     glimepiride (AMARYL) 4 MG tablet; Take 1 tablet (4 mg total) by mouth daily with breakfast. -     canagliflozin (INVOKANA) 100 MG TABS tablet; Take 1 tablet (100 mg total) by mouth daily before breakfast. Continue blood sugar control as discussed in office today, low carbohydrate diet, and regular physical exercise as tolerated, 150 minutes per week (30 min each day, 5 days per week, or 50 min 3 days per week). Keep blood sugar logs with fasting goal of 80-130 mg/dl, post prandial less than 180.  For Hypoglycemia: BS <60 and Hyperglycemia BS >400; contact the clinic ASAP. Annual eye exams and foot exams are recommended.   Essential hypertension -     lisinopril (PRINIVIL,ZESTRIL) 20 MG tablet; Take 1 tablet (20 mg total) by mouth daily. Continue all antihypertensives as prescribed.  Remember to bring in your blood pressure log with you for your follow up appointment.  DASH/Mediterranean Diets are healthier choices for HTN.    Hyperlipidemia associated with type 2 diabetes mellitus (HCC) -     simvastatin (ZOCOR) 40 MG tablet; Take 1 tablet (40 mg total) by mouth daily. INSTRUCTIONS: Work on a low fat, heart healthy diet and participate in regular aerobic exercise program by working out at least 150 minutes per week. No fried foods. No junk foods, sodas, sugary drinks, unhealthy snacking, alcohol or smoking.    Vasculogenic erectile dysfunction, unspecified vasculogenic erectile dysfunction type -     sildenafil (VIAGRA) 100 MG tablet; Take 0.5-1 tablets (50-100 mg total) by mouth daily as needed for erectile dysfunction.    Patient has been counseled on age-appropriate routine health concerns for screening and prevention. These are reviewed and  up-to-date. Referrals have been placed accordingly. Immunizations are up-to-date or declined.    Subjective:   Chief Complaint  Patient presents with  . Follow-up    Pt. is here for a follow-up on diabetes. Pt. stated he took his medicine and had juice this morning.    HPI Lance Howell 47 y.o. male presents to office today for follow up.  CHRONIC HYPERTENSION Disease Monitoring  Blood pressure range: 120/70-80s.  Chest pain: no   Dyspnea: no   Claudication: no  Medication compliance: yes  Medication Side Effects  Lightheadedness: no   Urinary frequency: no   Edema: no   Impotence: yes  Preventitive Healthcare:  Exercise: no   Diet Pattern: salt not added to cooking  Salt Restriction:  yes  BP Readings from Last 3 Encounters:  08/02/17 126/73  05/05/17 120/85  02/16/17 95/64    Hyperlipidemia Patient presents for follow up to hyperlipidemia.  He is medication compliant taking simvastatin 75m daily . He is diet compliant and denies skin xanthelasma or statin intolerance including myalgias.  Lab Results  Component Value Date   CHOL 159 04/02/2017   Lab Results  Component Value Date   HDL 52 04/02/2017   Lab Results  Component Value Date   LDLCALC 80 04/02/2017   Lab Results  Component Value Date   TRIG 134 04/02/2017   Lab Results  Component Value Date   CHOLHDL 3.1 04/02/2017   No results found for: LDLDIRECT   Type 2 Diabetes Mellitus Disease course  has been stable however A1c increased slightly today. There are no hypoglycemic symptoms. There are no hypoglycemic complications. Symptoms are stable. There are diabetic complications (Erectile dysfunction). Risk factors for coronary artery disease include family history, dyslipidemia, diabetes mellitus, obesity, hypertension, sedentary lifestyle and stress. Current diabetic treatment includes Invokana 100 mg daily, Janumet 50-1000 mg BID, glimiperide 46m daily. Patient is compliant with treatment all of the  time and monitors blood glucose at home once a day.   Home blood glucose trend : (FBS 100-110 mg/dl)  Weight is stable. Patient follows a generally healthy diet. Meal planning includes avoidance of concentrated sweets. Patient has not seen a dietician. Patient is not compliant with exercise.   An ACE inhibitor/angiotensin II receptor blocker is being taken. Patient does not see a podiatrist. Eye exam is not current.  Lab Results  Component Value Date   HGBA1C 7.7 (A) 08/02/2017   Lab Results  Component Value Date   HGBA1C 7.5 (H) 04/02/2017     Erectile Dysfunction Patient complains of erectile dysfunction.  Onset of dysfunction was a few months ago and was gradual in onset.  Patient states the nature of difficulty is premature ejaculation. Full erections occur with intercourse, with masturbation, on awakening and spontaneously. Partial erections occur never. Libido is not affected. Risk factors for ED include cardiovascular disease, diabetes mellitus and antihypertensive medications, lisinopril. Patient denies history of cranial, spinal, or pelvic trauma. Patient's expectations as to sexual function to have longer periods of sexual intercourse.  Patient's description of relationship w/partner no issues.  Previous treatment of ED includes none   Review of Systems  Constitutional: Negative for fever, malaise/fatigue and weight loss.  HENT: Negative.  Negative for nosebleeds.   Eyes: Negative.  Negative for blurred vision, double vision and photophobia.  Respiratory: Negative.  Negative for cough and shortness of breath.   Cardiovascular: Negative.  Negative for chest pain, palpitations and leg swelling.  Gastrointestinal: Negative.  Negative for heartburn, nausea and vomiting.  Genitourinary:       SEE HPI  Musculoskeletal: Negative.  Negative for myalgias.  Neurological: Negative.  Negative for dizziness, focal weakness, seizures and headaches.  Psychiatric/Behavioral: Negative.  Negative  for suicidal ideas.    Past Medical History:  Diagnosis Date  . Diabetes mellitus without complication ([facility]     Past Surgical History:  Procedure Laterality Date  . APPENDECTOMY    . kidney stone removal      Family History  Problem Relation Age of Onset  . Hypertension Mother   . Diabetes Father     Social History Reviewed with no changes to be made today.   Outpatient Medications Prior to Visit  Medication Sig Dispense Refill  . Blood Glucose Monitoring Suppl (TRUE METRIX METER) w/Device KIT Use as instructed. 1 kit 0  . glucose blood test strip Use as instructed 100 each 12  . sitaGLIPtin-metformin (JANUMET) 50-1000 MG tablet Take 1 tablet by mouth 2 (two) times daily with a meal. 60 tablet 6  . TRUEPLUS LANCETS 28G MISC Use as instructed 100 each 12  . canagliflozin (INVOKANA) 100 MG TABS tablet Take 1 tablet (100 mg total) by mouth daily before breakfast. 90 tablet 1  . glimepiride (AMARYL) 4 MG tablet Take 1 tablet (4 mg total) by mouth daily with breakfast. 90 tablet 1  . lisinopril (PRINIVIL,ZESTRIL) 20 MG tablet Take 1 tablet (20 mg total) by mouth daily. 90 tablet 1  . simvastatin (ZOCOR) 40 MG tablet Take 1 tablet (40 mg total)  by mouth daily. 90 tablet 1  . pramoxine-hydrocortisone (PROCTOCREAM-HC) 1-1 % rectal cream Place 1 application rectally 2 (two) times daily. (Patient not taking: Reported on 08/02/2017) 30 g 0   No facility-administered medications prior to visit.     Not on File     Objective:    BP 126/73 (BP Location: Left Arm, Patient Position: Sitting, Cuff Size: Normal)   Pulse 78   Temp 98.4 F (36.9 C) (Oral)   Ht _0  (1.702 m)   Wt 221 lb 12.8 oz (100.6 kg)   SpO2 95%   BMI 34.74 kg/m  Wt Readings from Last 3 Encounters:  08/02/17 221 lb 12.8 oz (100.6 kg)  05/05/17 221 lb 12.8 oz (100.6 kg)  02/16/17 220 lb (99.8 kg)    Physical Exam  Constitutional: He is oriented to person, place, and time. He appears well-developed and  well-nourished. He is cooperative.  HENT:  Head: Normocephalic and atraumatic.  Eyes: EOM are normal.  Neck: Normal range of motion.  Cardiovascular: Normal rate, regular rhythm and normal heart sounds. Exam reveals no gallop and no friction rub.  No murmur heard. Pulmonary/Chest: Effort normal and breath sounds normal. No tachypnea. No respiratory distress. He has no decreased breath sounds. He has no wheezes. He has no rhonchi. He has no rales. He exhibits no tenderness.  Abdominal: Bowel sounds are normal.  Musculoskeletal: Normal range of motion. He exhibits no edema.  Neurological: He is alert and oriented to person, place, and time. Coordination normal.  Skin: Skin is warm and dry.  Psychiatric: He has a normal mood and affect. His behavior is normal. Judgment and thought content normal.  Nursing note and vitals reviewed.        Patient has been counseled extensively about nutrition and exercise as well as the importance of adherence with medications and regular follow-up. The patient was given clear instructions to go to ER or return to medical center if symptoms don't improve, worsen or new problems develop. The patient verbalized understanding.   Follow-up: Return in about 3 months (around 11/02/2017) for HTN/HPL/DM.   Gildardo Pounds, FNP-BC [facility] and [facility], [place]   08/02/2017, 9:17 AM

## 2017-09-14 MED FILL — INVOKANA 100 MG TABLET: 100 | 30 days supply | Qty: 30 | Fill #1

## 2017-09-14 MED FILL — TRUE METRIX TEST STRIP: 25 days supply | Qty: 100 | Fill #3

## 2017-09-14 MED FILL — TRUEplus LANCETS 28G MISC: 25 days supply | Qty: 100 | Fill #3

## 2017-09-14 MED FILL — ?GLIMEPIRIDE 4 MG TABLET: 4 | 30 days supply | Qty: 30 | Fill #3

## 2017-09-14 MED FILL — LISINOPRIL 20 MG TAB: 20 | 30 days supply | Qty: 30 | Fill #1

## 2017-09-15 MED FILL — ?SIMVASTATIN 40MG TABS: 40 | 30 days supply | Qty: 30 | Fill #1

## 2017-10-04 ENCOUNTER — Ambulatory Visit: Payer: No Typology Code available for payment source | Attending: Nurse Practitioner

## 2017-10-15 MED FILL — SIMVASTATIN 40 MG TABLET: 40 | 30 days supply | Qty: 30 | Fill #2

## 2017-10-15 MED FILL — GLIMEPIRIDE 4 MG TABS: 4 | 30 days supply | Qty: 30 | Fill #4

## 2017-10-15 MED FILL — LISINOPRIL 20 MG TAB: 20 | 30 days supply | Qty: 30 | Fill #2

## 2017-10-18 MED FILL — INVOKANA 100 MG TABLET: 100 | 30 days supply | Qty: 30 | Fill #2

## 2017-11-02 ENCOUNTER — Other Ambulatory Visit (HOSPITAL_COMMUNITY)
Admission: RE | Admit: 2017-11-02 | Discharge: 2017-11-02 | Disposition: A | Discharge status: Home / Self Care | Payer: No Typology Code available for payment source | Source: Ambulatory Visit | Attending: Nurse Practitioner | Admitting: Nurse Practitioner

## 2017-11-02 ENCOUNTER — Encounter: Payer: Self-pay | Admitting: Nurse Practitioner

## 2017-11-02 ENCOUNTER — Ambulatory Visit (HOSPITAL_BASED_OUTPATIENT_CLINIC_OR_DEPARTMENT_OTHER): Payer: No Typology Code available for payment source | Admitting: Nurse Practitioner

## 2017-11-02 VITALS — BP 118/77 | HR 75 | Temp 98.3°F | Ht 67.0 in | Wt 221.8 lb

## 2017-11-02 DIAGNOSIS — Z7984 Long term (current) use of oral hypoglycemic drugs: Secondary | ICD-10-CM

## 2017-11-02 DIAGNOSIS — E785 Hyperlipidemia, unspecified: Secondary | ICD-10-CM | POA: Insufficient documentation

## 2017-11-02 DIAGNOSIS — I10 Essential (primary) hypertension: Secondary | ICD-10-CM | POA: Insufficient documentation

## 2017-11-02 DIAGNOSIS — Z23 Encounter for immunization: Secondary | ICD-10-CM | POA: Insufficient documentation

## 2017-11-02 DIAGNOSIS — Z79899 Other long term (current) drug therapy: Secondary | ICD-10-CM | POA: Insufficient documentation

## 2017-11-02 DIAGNOSIS — E118 Type 2 diabetes mellitus with unspecified complications: Secondary | ICD-10-CM | POA: Insufficient documentation

## 2017-11-02 DIAGNOSIS — K6289 Other specified diseases of anus and rectum: Secondary | ICD-10-CM

## 2017-11-02 DIAGNOSIS — E782 Mixed hyperlipidemia: Secondary | ICD-10-CM | POA: Diagnosis not present

## 2017-11-02 DIAGNOSIS — E1169 Type 2 diabetes mellitus with other specified complication: Secondary | ICD-10-CM | POA: Insufficient documentation

## 2017-11-02 DIAGNOSIS — E119 Type 2 diabetes mellitus without complications: Secondary | ICD-10-CM | POA: Diagnosis not present

## 2017-11-02 LAB — POCT GLYCOSYLATED HEMOGLOBIN (HGB A1C): Hemoglobin A1C: 7.8 % — AB (ref 4.0–5.6)

## 2017-11-02 LAB — GLUCOSE, POCT (MANUAL RESULT ENTRY): POC Glucose: 181 mg/dl — AB (ref 70–99)

## 2017-11-02 MED ORDER — LISINOPRIL 20 MG PO TABS: 20.0000 mg | ORAL_TABLET | Freq: Every day | ORAL | 1 refills | Status: DC

## 2017-11-02 MED ORDER — GLIMEPIRIDE 4 MG PO TABS: 4.0000 mg | ORAL_TABLET | Freq: Every day | ORAL | 1 refills | Status: DC

## 2017-11-02 MED ORDER — SITAGLIPTIN PHOS-METFORMIN HCL 50-1000 MG PO TABS: 1.0000 | ORAL_TABLET | Freq: Two times a day (BID) | ORAL | 2 refills | Status: DC

## 2017-11-02 MED ORDER — CANAGLIFLOZIN 100 MG PO TABS: 100.0000 mg | ORAL_TABLET | Freq: Every day | ORAL | 1 refills | Status: AC

## 2017-11-02 MED ORDER — SIMVASTATIN 40 MG PO TABS: 40.0000 mg | ORAL_TABLET | Freq: Every day | ORAL | 1 refills | Status: DC

## 2017-11-02 MED FILL — JANUMET 50-1,000 MG TABLET: 50-1000 | 30 days supply | Qty: 60 | Fill #0

## 2017-11-02 NOTE — Addendum Note (Signed)
Addended byVidal Schwalbe: Naeem Quillin on: 11/02/2017 10:46 AM   Modules accepted: Orders

## 2017-11-02 NOTE — Progress Notes (Signed)
Assessment & Plan:  Lance Howell was seen today for follow-up.  Diagnoses and all orders for this visit:  Controlled type 2 diabetes mellitus with complication, without long-term current use of insulin (HCC) -     Glucose (CBG) -     HgB A1c -     Microalbumin/Creatinine Ratio, Urine -     CBC -     CMP14+EGFR -     glimepiride (AMARYL) 4 MG tablet; Take 1 tablet (4 mg total) by mouth daily with breakfast. -     sitaGLIPtin-metformin (JANUMET) 50-1000 MG tablet; Take 1 tablet by mouth 2 (two) times daily with a meal. -     canagliflozin (INVOKANA) 100 MG TABS tablet; Take 1 tablet (100 mg total) by mouth daily before breakfast. Continue blood sugar control as discussed in office today, low carbohydrate diet, and regular physical exercise as tolerated, 150 minutes per week (30 min each day, 5 days per week, or 50 min 3 days per week). Keep blood sugar logs with fasting goal of 90-130 mg/dl, post prandial (after you eat) less than 180.  For Hypoglycemia: BS <60 and Hyperglycemia BS >400; contact the clinic ASAP. Annual eye exams and foot exams are recommended.   Essential hypertension -     lisinopril (PRINIVIL,ZESTRIL) 20 MG tablet; Take 1 tablet (20 mg total) by mouth daily. Continue all antihypertensives as prescribed.  Remember to bring in your blood pressure log with you for your follow up appointment.  DASH/Mediterranean Diets are healthier choices for HTN.    Mixed hyperlipidemia -     simvastatin (ZOCOR) 40 MG tablet; Take 1 tablet (40 mg total) by mouth daily. INSTRUCTIONS: Work on a low fat, heart healthy diet and participate in regular aerobic exercise program by working out at least 150 minutes per week; 5 days a week-30 minutes per day. Avoid red meat, fried foods. junk foods, sodas, sugary drinks, unhealthy snacking, alcohol and smoking.  Drink at least 48oz of water per day and monitor your carbohydrate intake daily.    Perirectal burning -     PSA -     Urinalysis,  Complete -     CULTURE, URINE COMPREHENSIVE -     Urine cytology ancillary only    Patient has been counseled on age-appropriate routine health concerns for screening and prevention. These are reviewed and up-to-date. Referrals have been placed accordingly. Immunizations are up-to-date or declined.    Subjective:   Chief Complaint  Patient presents with  . Follow-up    Pt. is here to follow-up on diabetes.    HPI Lance Howell 47 y.o. male presents to office today for follow up to Diabetes Mellitus Type 2, HPL and HTN. VRI was used to communicate directly with patient for the entire encounter including providing detailed patient instructions. He is requesting to have his prostate level checked. Endorses a sensation of pain between his testicles and rectum with onset 2 weeks ago and it  occurs 2-3x weeks when he is sitting. Pain lasts 10 minutes and goes away on its own.   Type 2 Diabetes Mellitus Disease course has been show no change. There are no hypoglycemic symptoms. There are no hypoglycemic complications. Symptoms are stable. There are diabetic complications. Risk factors for coronary artery disease include family history, dyslipidemia, diabetes mellitus, obesity, hypertension, sedentary lifestyle and stress. Current diabetic treatment includes janumet 50-1000 mg BID, glimepiride '4mg'$  daily, invokana '100mg'$  daily . Patient is compliant with treatment all of the time and monitors blood glucose  at home every few days.   Home blood glucose trend : (FBS 130-160s mg/dl)  Weight is  stable. Patient follows a generally healthy diet. Meal planning includes avoidance of concentrated sweets. However he works in [employer] and does not pack his lunch or meal prep as he reports he does not have a microwave at his work site that he can use. He is eating sandwiches and take out for lunch. The choices are not healthy however this is all that is available. Patient has not seen a dietician. Patient is not  compliant with exercise.   An ACE inhibitor/angiotensin II receptor blocker is being taken. Patient does not see a podiatrist. Eye exam is current.  Lab Results  Component Value Date   HGBA1C 7.8 (A) 11/02/2017   Lab Results  Component Value Date   HGBA1C 7.7 (A) 08/02/2017   CHRONIC HYPERTENSION Disease Monitoring  Blood pressure range BP Readings from Last 3 Encounters:  11/02/17 118/77  08/02/17 126/73  05/05/17 120/85   Chest pain: no   Dyspnea: no   Claudication: no  Medication compliance: yes, taking lisinopril 20 mg daily  Medication Side Effects  Lightheadedness: no   Urinary frequency: no   Edema: no   Impotence: yes; taking viagra Preventitive Healthcare:  Exercise: no   Diet Pattern: diet: general  Salt Restriction:  no   Hyperlipidemia Patient presents for follow up to hyperlipidemia.  He is medication compliant taking simvastatin 58m daily. He is diet compliant and denies skin xanthelasma or statin intolerance including myalgias.  Lab Results  Component Value Date   CHOL 159 04/02/2017   Lab Results  Component Value Date   HDL 52 04/02/2017   Lab Results  Component Value Date   LDLCALC 80 04/02/2017   Lab Results  Component Value Date   TRIG 134 04/02/2017   Lab Results  Component Value Date   CHOLHDL 3.1 04/02/2017   No results found for: LDLDIRECT Review of Systems  Constitutional: Negative for fever, malaise/fatigue and weight loss.  HENT: Negative.  Negative for nosebleeds.   Eyes: Negative.  Negative for blurred vision, double vision and photophobia.  Respiratory: Negative.  Negative for cough and shortness of breath.   Cardiovascular: Negative.  Negative for chest pain, palpitations and leg swelling.  Gastrointestinal: Negative.  Negative for heartburn, nausea and vomiting.  Genitourinary:       SEE HPI  Musculoskeletal: Negative.  Negative for myalgias.  Neurological: Negative.  Negative for dizziness, focal weakness, seizures and  headaches.  Psychiatric/Behavioral: Negative.  Negative for suicidal ideas.    Past Medical History:  Diagnosis Date  . Diabetes mellitus without complication ([facility]   . Hyperlipidemia   . Hypertension     Past Surgical History:  Procedure Laterality Date  . APPENDECTOMY    . kidney stone removal      Family History  Problem Relation Age of Onset  . Hypertension Mother   . Diabetes Father     Social History Reviewed with no changes to be made today.   Outpatient Medications Prior to Visit  Medication Sig Dispense Refill  . Blood Glucose Monitoring Suppl (TRUE METRIX METER) w/Device KIT Use as instructed. 1 kit 0  . glucose blood test strip Use as instructed 100 each 12  . TRUEPLUS LANCETS 28G MISC Use as instructed 100 each 12  . lisinopril (PRINIVIL,ZESTRIL) 20 MG tablet Take 1 tablet (20 mg total) by mouth daily. 90 tablet 1  . simvastatin (ZOCOR) 40 MG tablet Take 1  tablet (40 mg total) by mouth daily. 90 tablet 1  . sitaGLIPtin-metformin (JANUMET) 50-1000 MG tablet Take 1 tablet by mouth 2 (two) times daily with a meal. 60 tablet 6  . pramoxine-hydrocortisone (PROCTOCREAM-HC) 1-1 % rectal cream Place 1 application rectally 2 (two) times daily. (Patient not taking: Reported on 11/02/2017) 30 g 0  . sildenafil (VIAGRA) 100 MG tablet Take 0.5-1 tablets (50-100 mg total) by mouth daily as needed for erectile dysfunction. (Patient not taking: Reported on 11/02/2017) 5 tablet 11  . glimepiride (AMARYL) 4 MG tablet Take 1 tablet (4 mg total) by mouth daily with breakfast. 90 tablet 1   No facility-administered medications prior to visit.     Not on File     Objective:    BP 118/77 (BP Location: Left Arm, Patient Position: Sitting, Cuff Size: Large)   Pulse 75   Temp 98.3 F (36.8 C) (Oral)   Ht _0  (1.702 m)   Wt 221 lb 12.8 oz (100.6 kg)   SpO2 95%   BMI 34.74 kg/m  Wt Readings from Last 3 Encounters:  11/02/17 221 lb 12.8 oz (100.6 kg)  08/02/17 221 lb 12.8 oz  (100.6 kg)  05/05/17 221 lb 12.8 oz (100.6 kg)    Physical Exam  Constitutional: He is oriented to person, place, and time. He appears well-developed and well-nourished. He is cooperative.  HENT:  Head: Normocephalic and atraumatic.  Eyes: EOM are normal.  Neck: Normal range of motion.  Cardiovascular: Normal rate, regular rhythm, normal heart sounds and intact distal pulses. Exam reveals no gallop and no friction rub.  No murmur heard. Pulmonary/Chest: Effort normal and breath sounds normal. No tachypnea. No respiratory distress. He has no decreased breath sounds. He has no wheezes. He has no rhonchi. He has no rales. He exhibits no tenderness.  Abdominal: Soft. Bowel sounds are normal.  Genitourinary:  Genitourinary Comments: Prostate exam deferred  Musculoskeletal: Normal range of motion. He exhibits no edema.       Legs: Neurological: He is alert and oriented to person, place, and time. Coordination normal.  Skin: Skin is warm and dry.  Psychiatric: He has a normal mood and affect. His behavior is normal. Judgment and thought content normal.  Nursing note and vitals reviewed.      Patient has been counseled extensively about nutrition and exercise as well as the importance of adherence with medications and regular follow-up. The patient was given clear instructions to go to ER or return to medical center if symptoms don't improve, worsen or new problems develop. The patient verbalized understanding.   Follow-up: Return in about 3 months (around 02/01/2018) for Physical and DM.   Gildardo Pounds, FNP-BC [facility] and [facility] [place], [place]   11/02/2017, 9:34 AM

## 2017-11-03 LAB — URINE CYTOLOGY ANCILLARY ONLY
CHLAMYDIA, DNA PROBE: NEGATIVE
NEISSERIA GONORRHEA: NEGATIVE
Trichomonas: NEGATIVE

## 2017-11-03 LAB — URINALYSIS, COMPLETE
BILIRUBIN UA: NEGATIVE
Leukocytes, UA: NEGATIVE
NITRITE UA: NEGATIVE
Protein, UA: NEGATIVE
RBC, UA: NEGATIVE
Urobilinogen, Ur: 0.2 mg/dL (ref 0.2–1.0)
pH, UA: 5 (ref 5.0–7.5)

## 2017-11-03 LAB — CMP14+EGFR
ALT: 49 IU/L — AB (ref 0–44)
AST: 27 IU/L (ref 0–40)
Albumin/Globulin Ratio: 2.2 (ref 1.2–2.2)
Albumin: 4.9 g/dL (ref 3.5–5.5)
Alkaline Phosphatase: 68 IU/L (ref 39–117)
BUN/Creatinine Ratio: 19 (ref 9–20)
BUN: 16 mg/dL (ref 6–24)
Bilirubin Total: 0.5 mg/dL (ref 0.0–1.2)
CALCIUM: 9.7 mg/dL (ref 8.7–10.2)
CO2: 24 mmol/L (ref 20–29)
CREATININE: 0.83 mg/dL (ref 0.76–1.27)
Chloride: 99 mmol/L (ref 96–106)
GFR calc Af Amer: 122 mL/min/{1.73_m2} (ref >59)
GFR, EST NON AFRICAN AMERICAN: 106 mL/min/{1.73_m2} (ref >59)
GLOBULIN, TOTAL: 2.2 g/dL (ref 1.5–4.5)
Glucose: 175 mg/dL — ABNORMAL HIGH (ref 65–99)
Potassium: 4.3 mmol/L (ref 3.5–5.2)
SODIUM: 138 mmol/L (ref 134–144)
Total Protein: 7.1 g/dL (ref 6.0–8.5)

## 2017-11-03 LAB — PSA: Prostate Specific Ag, Serum: 0.5 ng/mL (ref 0.0–4.0)

## 2017-11-03 LAB — MICROSCOPIC EXAMINATION
Casts: NONE SEEN /lpf
Epithelial Cells (non renal): NONE SEEN /hpf (ref 0–10)
RBC MICROSCOPIC, UA: NONE SEEN /HPF (ref 0–2)

## 2017-11-03 LAB — CBC
HEMATOCRIT: 47.5 % (ref 37.5–51.0)
Hemoglobin: 16.1 g/dL (ref 13.0–17.7)
MCH: 29 pg (ref 26.6–33.0)
MCHC: 33.9 g/dL (ref 31.5–35.7)
MCV: 86 fL (ref 79–97)
Platelets: 262 10*3/uL (ref 150–450)
RBC: 5.55 x10E6/uL (ref 4.14–5.80)
RDW: 12.9 % (ref 12.3–15.4)
WBC: 8.9 10*3/uL (ref 3.4–10.8)

## 2017-11-03 LAB — MICROALBUMIN / CREATININE URINE RATIO
Creatinine, Urine: 78.1 mg/dL
Microalb/Creat Ratio: 20.2 mg/g creat (ref 0.0–30.0)
Microalbumin, Urine: 15.8 ug/mL

## 2017-11-05 LAB — CULTURE, URINE COMPREHENSIVE

## 2017-11-08 LAB — URINE CYTOLOGY ANCILLARY ONLY: CANDIDA VAGINITIS: NEGATIVE

## 2017-11-09 ENCOUNTER — Telehealth: Payer: Self-pay

## 2017-11-09 NOTE — Telephone Encounter (Signed)
CMA attempt to reach patient to inform on results.  No answer and left a VM for patient to call back.  If patient call back, please inform:  Prostate level is normal. Urine does not show any bacteria. CBC does not show any infection in the blood or anemia. The pain you felt may have been nerve related or related to your diabetes.  A letter will be send out to reach patient.

## 2017-11-09 NOTE — Telephone Encounter (Signed)
-----   Message from Claiborne Rigg, NP sent at 11/07/2017 11:38 AM EDT ----- Prostate level is normal. Urine does not show any bacteria. CBC does not show any infection in the blood or anemia. The pain you felt may have been nerve related or related to your diabetes.

## 2017-11-16 MED FILL — LISINOPRIL 20 MG TAB: 20 | 30 days supply | Qty: 30 | Fill #3

## 2017-11-16 MED FILL — INVOKANA 100 MG TABLET: 100 | 30 days supply | Qty: 30 | Fill #3

## 2017-11-16 MED FILL — $JANUMET 50-1000 MG TABLET: 50-1000 | 30 days supply | Qty: 60 | Fill #0

## 2017-11-16 MED FILL — GLIMEPIRIDE 4 MG TABS: 4 | 30 days supply | Qty: 30 | Fill #5

## 2017-11-16 MED FILL — SIMVASTATIN 40 MG TABLET: 40 | 30 days supply | Qty: 30 | Fill #3

## 2017-12-17 MED FILL — $JANUMET 50-1000 MG TABLET: 50-1000 | 60 days supply | Qty: 120 | Fill #1

## 2017-12-17 MED FILL — GLIMEPIRIDE 4 MG TABLET: 4 | 30 days supply | Qty: 30 | Fill #0

## 2017-12-17 MED FILL — INVOKANA 100 MG TABLET: 100 | 30 days supply | Qty: 30 | Fill #4

## 2017-12-17 MED FILL — SIMVASTATIN 40 MG TABLET: 40 | 30 days supply | Qty: 30 | Fill #4

## 2017-12-17 MED FILL — LISINOPRIL 20 MG TAB: 20 | 30 days supply | Qty: 30 | Fill #4

## 2018-01-19 MED FILL — INVOKANA 100 MG TABLET: 100 | 30 days supply | Qty: 30 | Fill #5

## 2018-01-19 MED FILL — LISINOPRIL 20 MG TAB: 20 | 30 days supply | Qty: 30 | Fill #5

## 2018-01-19 MED FILL — SIMVASTATIN 40 MG TABLET: 40 | 30 days supply | Qty: 30 | Fill #5

## 2018-01-19 MED FILL — GLIMEPIRIDE 4 MG TABS: 4 | 30 days supply | Qty: 30 | Fill #1

## 2018-01-26 ENCOUNTER — Encounter: Payer: No Typology Code available for payment source | Admitting: Nurse Practitioner

## 2018-02-15 ENCOUNTER — Ambulatory Visit (HOSPITAL_BASED_OUTPATIENT_CLINIC_OR_DEPARTMENT_OTHER): Payer: No Typology Code available for payment source | Admitting: Nurse Practitioner

## 2018-02-15 ENCOUNTER — Encounter: Payer: Self-pay | Admitting: Nurse Practitioner

## 2018-02-15 ENCOUNTER — Other Ambulatory Visit: Payer: Self-pay | Admitting: Nurse Practitioner

## 2018-02-15 ENCOUNTER — Telehealth: Payer: Self-pay | Admitting: Nurse Practitioner

## 2018-02-15 ENCOUNTER — Ambulatory Visit (HOSPITAL_COMMUNITY)
Admission: RE | Admit: 2018-02-15 | Discharge: 2018-02-15 | Disposition: A | Discharge status: Home / Self Care | Payer: No Typology Code available for payment source | Source: Ambulatory Visit | Attending: Nurse Practitioner | Admitting: Nurse Practitioner

## 2018-02-15 VITALS — BP 124/79 | HR 72 | Ht 68.4 in | Wt 215.4 lb

## 2018-02-15 DIAGNOSIS — E782 Mixed hyperlipidemia: Secondary | ICD-10-CM | POA: Diagnosis not present

## 2018-02-15 DIAGNOSIS — E118 Type 2 diabetes mellitus with unspecified complications: Secondary | ICD-10-CM | POA: Insufficient documentation

## 2018-02-15 DIAGNOSIS — E119 Type 2 diabetes mellitus without complications: Secondary | ICD-10-CM | POA: Diagnosis not present

## 2018-02-15 DIAGNOSIS — Z0000 Encounter for general adult medical examination without abnormal findings: Secondary | ICD-10-CM

## 2018-02-15 DIAGNOSIS — G8929 Other chronic pain: Secondary | ICD-10-CM

## 2018-02-15 DIAGNOSIS — E1165 Type 2 diabetes mellitus with hyperglycemia: Secondary | ICD-10-CM | POA: Insufficient documentation

## 2018-02-15 DIAGNOSIS — Z0001 Encounter for general adult medical examination with abnormal findings: Secondary | ICD-10-CM | POA: Diagnosis not present

## 2018-02-15 DIAGNOSIS — M79672 Pain in left foot: Secondary | ICD-10-CM | POA: Insufficient documentation

## 2018-02-15 DIAGNOSIS — I10 Essential (primary) hypertension: Secondary | ICD-10-CM | POA: Diagnosis not present

## 2018-02-15 DIAGNOSIS — M7752 Other enthesopathy of left foot: Secondary | ICD-10-CM

## 2018-02-15 DIAGNOSIS — L84 Corns and callosities: Secondary | ICD-10-CM

## 2018-02-15 LAB — POCT GLYCOSYLATED HEMOGLOBIN (HGB A1C): HEMOGLOBIN A1C: 7.3 % — AB (ref 4.0–5.6)

## 2018-02-15 LAB — GLUCOSE, POCT (MANUAL RESULT ENTRY): POC GLUCOSE: 138 mg/dL — AB (ref 70–99)

## 2018-02-15 MED ORDER — LISINOPRIL 20 MG PO TABS: 20.0000 mg | ORAL_TABLET | Freq: Every day | ORAL | 1 refills | Status: DC

## 2018-02-15 MED ORDER — SIMVASTATIN 40 MG PO TABS: 40.0000 mg | ORAL_TABLET | Freq: Every day | ORAL | 1 refills | Status: DC

## 2018-02-15 MED ORDER — SITAGLIPTIN PHOS-METFORMIN HCL 50-1000 MG PO TABS: 1.0000 | ORAL_TABLET | Freq: Two times a day (BID) | ORAL | 2 refills | Status: DC

## 2018-02-15 MED ORDER — GLIMEPIRIDE 4 MG PO TABS: 4.0000 mg | ORAL_TABLET | Freq: Every day | ORAL | 1 refills | Status: DC

## 2018-02-15 MED FILL — SIMVASTATIN 40 MG TABLET: 40 | 30 days supply | Qty: 30 | Fill #0

## 2018-02-15 MED FILL — GLIMEPIRIDE 4 MG TABS: 4 | 30 days supply | Qty: 30 | Fill #0

## 2018-02-15 MED FILL — LISINOPRIL 20 MG TAB: 20 | 30 days supply | Qty: 30 | Fill #0

## 2018-02-15 NOTE — Telephone Encounter (Signed)
1) Medication(s) Requested (by name): janumet  *patient says that the pharmacy told him he is not able to pickup his medication due to the insurance. Patient says that he was told pcp has to put in a request for medication. Please follow up. 2) Pharmacy of Choice: [facility]

## 2018-02-15 NOTE — Patient Instructions (Signed)
Atrofia del taln con rehabilitacin (Heel Pad Atrophy With Rehab) La atrofia del taln causa dolor en el taln. Los talones absorben gran parte del impacto cuando uno est de pie, camina o corre. La grasa que est debajo del taln lo protege y absorbe el impacto. Esta grasa contiene clulas grasas que estn separadas por bandas de tejido conjuntivo. Con el tiempo, la grasa puede degradarse y perder elasticidad y tamao (atrofia). Esto causa dolor en el taln, debido a la menor amortiguacin que tiene. En muchos casos, el dolor se produce en ambos talones. Es posible sentir un dolor fijo y continuo o quemazn que empeora al caminar o estar de pie. Este dolor puede ser peor para los atletas que practican deportes en superficies duras con mucho impacto para los talones. CAUSAS Esta afeccin aparece cuando la grasa que est debajo del hueso del taln se degrada con el transcurso del [place]. FACTORES DE RIESGO Es ms probable que esta afeccin se manifieste en las personas que:  Tienen 41aos o ms.  Practican deportes en los que se debe saltar y caer sobre superficies duras, como el bsquetbol.  Corren, especialmente si cuando corren apoyan primero los talones al caer.  Tienen sobrepeso.  Tienen diabetes, artritis reumatoide o una enfermedad en los vasos sanguneos.  Han recibido inyecciones con corticoides en la zona del taln.  Han tenido un traumatismo en la zona del taln, por ejemplo, al caer desde una altura. SNTOMAS El sntoma ms frecuente de esta afeccin es el dolor fijo y [place]constante, o la sensacin de Libbyquemazn en uno o ambos talones. El dolor puede empeorar en cualquiera de estas situaciones:  Al caminar o estar de pie, especialmente sobre superficies duras o durante perodos prolongados.  Al caminar descalzo.  Al usar calzado con suelas duras.  Al descansar por la noche.  Al hacer presin sobre el centro del taln (dolor a la palpacin). DIAGNSTICO Esta afeccin se  diagnostica en funcin de los sntomas, los antecedentes mdicos y un examen fsico. El mdico le revisar el taln y controlar si tiene dolor a la Armed forces logistics/support/administrative officerpalpacin en el centro. Tambin le indicar una ecografa para confirmar el diagnstico y medir el grosor del taln. TRATAMIENTO El tratamiento de esta afeccin incluye lo siguiente:  Reducir el tiempo que dedica a Advertising account plannercaminar, estar de pie o correr.  Evitar las actividades que causen Merck & Codolor.  Tomar un analgsico o un medicamento antinflamatorio de Sales promotion account executiveventa libre.  Usar calzado con talonera acolchada de proteccin en el taln o usar una plantilla blanda para el calzado (ortopdica).  Usar siempre calzado, incluso dentro de su casa.  Colocar cinta en los talones, para aumentar el apoyo.  Si tiene sobrepeso, Liberty Globalbajar de peso. INSTRUCCIONES PARA EL CUIDADO EN EL HOGAR Control del dolor, la rigidez y la hinchazn  Si se lo indican, aplique hielo sobre la zona lesionada. ? Ponga el hielo en una bolsa plstica. ? Coloque una Hughes Supplytoalla entre el taln y la bolsa de hielo. ? Coloque el hielo durante 20minutos, 2 a 3veces por da.  Levante (eleve) el pie por encima del nivel del corazn cuando est sentado o acostado. Actividad  Retome sus actividades normales como se lo haya indicado el mdico. Pregntele al mdico qu actividades son seguras para usted.  Haga ejercicios como se lo haya indicado el mdico. Instrucciones generales  No consuma ningn producto que contenga tabaco, lo que incluye cigarrillos, tabaco de Theatre managermascar y Administrator, Civil Servicecigarrillos electrnicos. Si necesita ayuda para dejar de fumar, consulte al American Expressmdico.  Baxter Internationalome los medicamentos  de venta libre y los recetados solamente como se lo haya indicado el mdico.  Concurra a todas las visitas de control como se lo haya indicado el mdico. Esto es importante. PREVENCIN  Dele a su cuerpo tiempo para Saks [employer]orporated perodos de [place].  Use zapatos cmodos que le brinden apoyo cuando realice una  actividad deportiva. SOLICITE ATENCIN MDICA SI:  Los sntomas no mejoran o empeoran. Esta informacin no tiene Theme park manager el consejo del mdico. Asegrese de hacerle al mdico cualquier pregunta que tenga. Document Released: 11/12/2005 Document Revised: 06/12/2014 Document Reviewed: 01/08/2015 Elsevier Interactive Patient Education  2019 Arvin[employer]itor.  Dolor del pie (Foot Pain) El dolor del pie puede tener muchas causas. Algunas causas frecuentes son las siguientes:  Una lesin.  Un esguince.  Artritis.  Ampollas.  Juanetes. INSTRUCCIONES PARA EL CUIDADO EN EL HOGAR Est atento a cualquier cambio en los sntomas. Tome estas medidas para aliviar las molestias:  Si se lo indican, aplique hielo sobre la zona afectada: ? Ponga el hielo en una bolsa plstica. ? Coloque una [employer] piel y la bolsa de hielo. ? Deje el hielo durante 15 a 20 minutos, 3 a 4veces por da, durante 2das.  Tome los medicamentos de venta libre y los recetados solamente como se lo haya indicado el mdico.  Use zapatos cmodos y anatmicos que tengan buen calce. No use zapatos con tacones altos.  No permanezca de pie ni camine durante largos perodos.  No levante mucho peso. Esto puede agregar presin en el pie.  Haga ejercicios de estiramiento para Engineer, materials del pie y la rigidez como se lo haya indicado el mdico.  Hgase masajes suaves en el pie.  Mantenga los pies, limpios y secos. SOLICITE ATENCIN MDICA SI:  El dolor no mejora despus de [address] de cuidados personales.  El dolor [place].  No puede apoyar el peso en el pie. SOLICITE ATENCIN MDICA DE INMEDIATO SI:  El pie se le adormece o tiene hormigueo.  El pie o los dedos de ese pie se le hinchan.  El pie o los dedos de ese pie se tornan de color blanco o [place].  Tiene el pie enrojecido y caliente al tacto. Esta informacin no tiene Theme park manager el consejo del mdico. Asegrese de hacerle al  mdico cualquier pregunta que tenga. Document Released: 05/20/2015 Document Revised: 05/20/2015 Document Reviewed: 02/21/2014 Elsevier Interactive Patient Education  2019 Elsevier Inc.  Espoln calcneo [place]  Los espolones calcneos son protuberancias seas que se forman en la parte inferior del hueso del taln (calcneo). Los espolones calcneos son frecuentes. A menudo causan inflamacin en la banda de tejido que AT[employer]T dedos con el hueso del taln (fascia plantar). Esto puede causar dolor en la zona inferior del pie, cerca del taln. Muchas personas con fascitis plantar tambin presentan espolones calcneos. Sin embargo, los espolones no son la causa del dolor de la fascitis plantar. Cules son las causas? Se desconoce la causa exacta de los espolones calcneos. Las D[employer] Energy [employer] ser las siguientes:  Presin sobre el hueso del taln.  Bandas de tejido (tendones) que tiran del hueso del taln. Qu incrementa el riesgo? Es ms probable que tenga esta afeccin si:  Es mayor de 40 aos.  Tiene sobrepeso.  Padece artritis debido al deterioro (artrosis).  Presenta inflamacin en la fascia plantar.  Participa en deportes o actividades que incluyen correr o Product/process development scientist.  [place] zapatos que no tienen buen calce. Cules son los signos  o los sntomas? Algunas personas no tienen sntomas. Si tiene sntomas, pueden incluir los siguientes:  Dolor en la parte inferior del taln.  Dolor que empeora al levantarse de la cama por primera vez.  Dolor que empeora despus de caminar o ponerse de pie. Cmo se diagnostica? Esta afeccin se puede diagnosticar en funcin de lo siguiente:  Los sntomas y antecedentes mdicos.  Un examen fsico.  Una radiografa del pie. Cmo se trata? El tratamiento de esta afeccin depende de cunto dolor sienta. Las opciones de tratamiento pueden incluir las siguientes:  Hacer ejercicios de elongacin.  Bajar de Groveportpeso, si es necesario.  Usar  calzados o plantillas especficos dentro del calzado (aparatos ortopdicos) para sentir comodidad y apoyo.  Usar frulas en los pies mientras duerme. Las frulas Johnson Controlsmantienen los pies en una posicin (generalmente a 90 grados) que debe prevenir y Engineer, materialsaliviar el dolor que siente al levantarse de la cama por primera vez. Adems, facilitan el estiramiento en la maana.  Tomar medicamentos de venta libre para Engineer, materialsaliviar el dolor, como antiinflamatorios no esteroideos (Mustang RidgeAINE).  Utilizar ondas sonoras de alta intensidad para fragmentar el espoln calcneo (terapia extracorprea por ondas de choque).  Recibir inyecciones de corticoesteroides en el taln para reducir la inflamacin.  Someterse a Transport plannerciruga si el espoln calcneo causa dolor a Air cabin crewlargo plazo (crnico). Siga estas indicaciones en su casa:  Actividad  Evite las actividades que le causen dolor hasta que se recupere o durante el tiempo que le haya indicado el mdico.  Realice ejercicios de estiramiento como se le indic. Elongue antes de hacer ejercicio o actividad fsica. Control del dolor, la rigidez y la hinchazn  Si se lo indican, aplquese hielo sobre el pie: ? Ponga el hielo en una bolsa plstica. ? Coloque una FirstEnergy Corptoalla entre la piel y la bolsa de hielo. ? Coloque el hielo durante 20minutos, de 2 a 3veces por da.  Mueva los dedos de los pies con frecuencia para evitar la rigidez y para reducir la hinchazn.  Cuando sea posible, levante (eleve) el pie por encima del nivel del corazn mientras est sentado o acostado. Instrucciones generales  Baxter Internationalome los medicamentos de venta libre y los recetados solamente como se lo haya indicado el mdico.  Use zapatos con buen apoyo y buen calce. Use las frulas, plantillas o aparatos ortopdicos como se lo haya indicado el mdico.  Si se lo recomiendan, consulte a su mdico para bajar de peso. Esto puede aliviar la presin en el pie.  No consuma ningn producto que contenga nicotina o tabaco, como  cigarrillos y Administrator, Civil Servicecigarrillos electrnicos. Estos productos pueden afectar el crecimiento seo y Comptrollerla cicatrizacin. Si necesita ayuda para dejar de fumar, consulte al mdico.  Concurra a todas las visitas de control como se lo haya indicado el mdico. Esto es importante. Comunquese con un mdico si:  El dolor no desaparece con Scientist, research (medical)el tratamiento.  El dolor [place]empeora. Resumen  Los espolones calcneos son protuberancias seas que se forman en la parte inferior del hueso del taln (calcneo).  Los espolones calcneos suelen causar inflamacin en la banda de tejido que AT&Tconecta los dedos del pie con el hueso del taln (fascia plantar). Esto puede causar dolor en la zona inferior del pie, cerca del taln.  Hacer ejercicios de elongacin, bajar de peso, usar zapatos o Catering managerplantillas especficos, usar frulas KB Home	[place]mientras duerme y tomar analgsicos puede Engineer, materialsaliviar el dolor y la rigidez.  Otras opciones de tratamiento pueden incluir ondas sonoras de alta intensidad para fragmentar el espoln calcneo, inyecciones con  corticoesteroides y [place]. Esta informacin no tiene Theme park manager el consejo del mdico. Asegrese de hacerle al mdico cualquier pregunta que tenga. Document Released: 11/12/2005 Document Revised: 03/16/2017 Document Reviewed: 03/16/2017 Elsevier Interactive Patient Education  2019 ArvinMeritor.

## 2018-02-15 NOTE — Progress Notes (Signed)
Assessment & Plan:  Lance Howell was seen today for annual exam.  Diagnoses and all orders for this visit:  Encounter for annual physical exam  Controlled type 2 diabetes mellitus with complication, without long-term current use of insulin ([place]) -     POCT glucose (manual entry) -     POCT glycosylated hemoglobin (Hb A1C) -     glimepiride (AMARYL) 4 MG tablet; Take 1 tablet (4 mg total) by mouth daily with breakfast. -     sitaGLIPtin-metformin (JANUMET) 50-1000 MG tablet; Take 1 tablet by mouth 2 (two) times daily with a meal. Continue blood sugar control as discussed in office today, low carbohydrate diet, and regular physical exercise as tolerated, 150 minutes per week (30 min each day, 5 days per week, or 50 min 3 days per week). Keep blood sugar logs with fasting goal of 90-130 mg/dl, post prandial (after you eat) less than 180.  For Hypoglycemia: BS <60 and Hyperglycemia BS >400; contact the clinic ASAP. Annual eye exams and foot exams are recommended.   Essential hypertension -     lisinopril (PRINIVIL,ZESTRIL) 20 MG tablet; Take 1 tablet (20 mg total) by mouth daily. Chronic and well controlled. Denies chest pain, shortness of breath, palpitations, lightheadedness, dizziness, headaches or BLE edema.  BP Readings from Last 3 Encounters:  02/15/18 124/79  11/02/17 118/77  08/02/17 126/73  Continue all antihypertensives as prescribed.  Remember to bring in your blood pressure log with you for your follow up appointment.  DASH/Mediterranean Diets are healthier choices for HTN.    Mixed hyperlipidemia -     simvastatin (ZOCOR) 40 MG tablet; Take 1 tablet (40 mg total) by mouth daily. Chronic. LDL not at goal <70.  Lab Results  Component Value Date   LDLCALC 80 04/02/2017  INSTRUCTIONS: Work on a low fat, heart healthy diet and participate in regular aerobic exercise program by working out at least 150 minutes per week; 5 days a week-30 minutes per day. Avoid red meat, fried foods.  junk foods, sodas, sugary drinks, unhealthy snacking, alcohol and smoking.  Drink at least 48oz of water per day and monitor your carbohydrate intake daily.   Chronic heel pain, left -     DG Foot Complete Left; Future    Patient has been counseled on age-appropriate routine health concerns for screening and prevention. These are reviewed and up-to-date. Referrals have been placed accordingly. Immunizations are up-to-date or declined.    Subjective:   Chief Complaint  Patient presents with  . Annual Exam   HPI Lance Howell 48 y.o. male presents to office today for annual physical.   Left Foot Pain Chronic. Onset 3 months. Describes pain as sharp. Denies any numbness, tingling, burning, trauma or injury. Pain is worse with weight bearing and pressure. He has taken ibuprofen for his pain which provides some relief. Pain can sometimes last up to 6-7 hours.  DM TYPE 2 Chronic, improved and stable. Monitoring blood glucose levels daily with average fasting levels 100-130 and Postprandial readings 150-160. He denies hypo or hyperglycemic symptoms. Current medications include  Janumet 50-1000 mg BID and amaryl 47m daily. He is overdue for eye exam. Currently taking an ACE inhibitor.  Lab Results  Component Value Date   HGBA1C 7.3 (A) 02/15/2018     Review of Systems  Constitutional: Negative for fever, malaise/fatigue and weight loss.  HENT: Negative.  Negative for nosebleeds.   Eyes: Negative.  Negative for blurred vision, double vision and photophobia.  Respiratory: Negative.  Negative for cough and shortness of breath.   Cardiovascular: Negative.  Negative for chest pain, palpitations and leg swelling.  Gastrointestinal: Negative.  Negative for heartburn, nausea and vomiting.  Genitourinary: Negative.   Musculoskeletal: Negative for myalgias.       SEE HPI  Skin: Negative.   Neurological: Negative.  Negative for dizziness, focal weakness, seizures and headaches.    Endo/Heme/Allergies: Negative.   Psychiatric/Behavioral: Negative.  Negative for suicidal ideas.    Past Medical History:  Diagnosis Date  . Diabetes mellitus without complication ([place])   . Hyperlipidemia   . Hypertension     Past Surgical History:  Procedure Laterality Date  . APPENDECTOMY    . kidney stone removal      Family History  Problem Relation Age of Onset  . Hypertension Mother   . Diabetes Father     Social History Reviewed with no changes to be made today.   Outpatient Medications Prior to Visit  Medication Sig Dispense Refill  . Blood Glucose Monitoring Suppl (TRUE METRIX METER) w/Device KIT Use as instructed. 1 kit 0  . glucose blood test strip Use as instructed 100 each 12  . TRUEPLUS LANCETS 28G MISC Use as instructed 100 each 12  . lisinopril (PRINIVIL,ZESTRIL) 20 MG tablet Take 1 tablet (20 mg total) by mouth daily. 90 tablet 1  . simvastatin (ZOCOR) 40 MG tablet Take 1 tablet (40 mg total) by mouth daily. 90 tablet 1  . sitaGLIPtin-metformin (JANUMET) 50-1000 MG tablet Take 1 tablet by mouth 2 (two) times daily with a meal. 180 tablet 2  . pramoxine-hydrocortisone (PROCTOCREAM-HC) 1-1 % rectal cream Place 1 application rectally 2 (two) times daily. (Patient not taking: Reported on 11/02/2017) 30 g 0  . sildenafil (VIAGRA) 100 MG tablet Take 0.5-1 tablets (50-100 mg total) by mouth daily as needed for erectile dysfunction. (Patient not taking: Reported on 11/02/2017) 5 tablet 11  . glimepiride (AMARYL) 4 MG tablet Take 1 tablet (4 mg total) by mouth daily with breakfast. 90 tablet 1   No facility-administered medications prior to visit.     No Known Allergies     Objective:    BP 124/79   Pulse 72   Ht 5' 8.4" (1.737 m)   Wt 215 lb 6.4 oz (97.7 kg)   SpO2 97%   BMI 32.37 kg/m  Wt Readings from Last 3 Encounters:  02/15/18 215 lb 6.4 oz (97.7 kg)  11/02/17 221 lb 12.8 oz (100.6 kg)  08/02/17 221 lb 12.8 oz (100.6 kg)    Physical  Exam Constitutional:      Appearance: He is well-developed.  HENT:     Head: Normocephalic and atraumatic.     Jaw: There is normal jaw occlusion.     Right Ear: Hearing, tympanic membrane, ear canal and external ear normal.     Left Ear: Hearing, tympanic membrane, ear canal and external ear normal.     Nose: Nose normal.     Right Turbinates: Not swollen or pale.     Left Turbinates: Not swollen or pale.     Right Sinus: No maxillary sinus tenderness or frontal sinus tenderness.     Left Sinus: No maxillary sinus tenderness or frontal sinus tenderness.  Eyes:     General: Lids are normal. No scleral icterus.       Right eye: No foreign body, discharge or hordeolum.        Left eye: No foreign body, discharge or hordeolum.     Extraocular  Movements: Extraocular movements intact.     Conjunctiva/sclera: Conjunctivae normal.     Pupils: Pupils are equal, round, and reactive to light.  Neck:     Musculoskeletal: Full passive range of motion without pain, normal range of motion and neck supple.     Thyroid: No thyroid mass or thyromegaly.     Trachea: No tracheal deviation.  Cardiovascular:     Rate and Rhythm: Normal rate and regular rhythm.     Pulses:          Dorsalis pedis pulses are 2+ on the right side and 2+ on the left side.       Posterior tibial pulses are 2+ on the right side and 2+ on the left side.     Heart sounds: Normal heart sounds. No murmur. No friction rub. No gallop.   Pulmonary:     Effort: Pulmonary effort is normal. No respiratory distress.     Breath sounds: Normal breath sounds. No decreased breath sounds, wheezing or rales.  Chest:     Chest wall: No tenderness.  Abdominal:     General: Abdomen is flat. Bowel sounds are normal. There is no distension.     Palpations: Abdomen is soft. There is no mass.     Tenderness: There is no abdominal tenderness. There is no guarding or rebound.     Hernia: There is no hernia in the right inguinal area or left  inguinal area.  Musculoskeletal: Normal range of motion.        General: No tenderness or deformity.     Right lower leg: No edema.     Left lower leg: No edema.     Right foot: Normal range of motion.     Left foot: Normal range of motion. No deformity, bunion or foot drop.  Feet:     Right foot:     Skin integrity: Callus and dry skin present. No skin breakdown.     Left foot:     Skin integrity: Callus and dry skin present. No skin breakdown.     Comments: There is pain endorsed when direct pressure is applied to the heel.  Lymphadenopathy:     Cervical: No cervical adenopathy.  Skin:    General: Skin is warm and dry.     Findings: No erythema.  Neurological:     Mental Status: He is alert and oriented to person, place, and time.     Cranial Nerves: No cranial nerve deficit.     Sensory: Sensation is intact. No sensory deficit.     Motor: Motor function is intact. No abnormal muscle tone or seizure activity.     Coordination: Coordination is intact. Romberg sign negative. Coordination normal. Finger-Nose-Finger Test and Heel to [facility] Test normal.     Gait: Gait is intact.     Deep Tendon Reflexes: Reflexes normal.     Reflex Scores:      Patellar reflexes are 2+ on the right side and 2+ on the left side. Psychiatric:        Attention and Perception: Attention normal.        Mood and Affect: Mood normal.        Speech: Speech normal.        Behavior: Behavior normal. Behavior is cooperative.        Thought Content: Thought content normal.        Cognition and Memory: Cognition and memory normal.        Judgment: Judgment normal.  Patient has been counseled extensively about nutrition and exercise as well as the importance of adherence with medications and regular follow-up. The patient was given clear instructions to go to ER or return to medical center if symptoms don't improve, worsen or new problems develop. The patient verbalized understanding.   Follow-up:  Return in about 3 months (around 05/17/2018) for DM and fasting labs.   Gildardo Pounds, FNP-BC [facility] and Eleva Orting, [place]   02/15/2018, 10:39 PM

## 2018-02-22 ENCOUNTER — Telehealth (INDEPENDENT_AMBULATORY_CARE_PROVIDER_SITE_OTHER): Payer: Self-pay

## 2018-02-22 MED FILL — $JANUMET 50-1000 MG TABLET: 50-1000 | 90 days supply | Qty: 180 | Fill #2

## 2018-02-22 NOTE — Telephone Encounter (Signed)
Call placed using pacific interpreter [phone]) left voicemail on patients home and mobile number aksing him to return call to [facility] at [phone]. Maryjean Morn, CMA

## 2018-02-22 NOTE — Telephone Encounter (Signed)
-----   Message from Claiborne Rigg, NP sent at 02/15/2018  8:37 PM EST ----- Xray shows a small bone spur in your heel. Heat application, ibuprofen or naproxen can help with pain relief. Also you can purchase special gel shoe inserts or orthotics to place in your shoes to help relieve the pain as well. I will refer you to podiatry for further recommendations.

## 2018-03-23 MED FILL — GLIMEPIRIDE 4 MG TABS: 4 | 30 days supply | Qty: 30 | Fill #1

## 2018-03-23 MED FILL — LISINOPRIL 20 MG TAB: 20 | 30 days supply | Qty: 30 | Fill #1

## 2018-03-23 MED FILL — SIMVASTATIN 40 MG TABLET: 40 | 30 days supply | Qty: 30 | Fill #1

## 2018-04-04 ENCOUNTER — Telehealth: Payer: Self-pay | Admitting: Nurse Practitioner

## 2018-04-04 NOTE — Telephone Encounter (Signed)
Lance Howell with cover my meds call to check on the statis of a prior auth for envocana 100mg    Please follow up

## 2018-04-04 NOTE — Telephone Encounter (Signed)
Pt is no longer taking Invokana, PA is not needed.

## 2018-04-26 MED FILL — SIMVASTATIN 40 MG TABLET: 40 | 30 days supply | Qty: 30 | Fill #2

## 2018-04-26 MED FILL — LISINOPRIL 20 MG TAB: 20 | 30 days supply | Qty: 30 | Fill #2

## 2018-04-26 MED FILL — GLIMEPIRIDE 4 MG TABS: 4 | 30 days supply | Qty: 30 | Fill #2

## 2018-05-11 ENCOUNTER — Telehealth: Payer: Self-pay | Admitting: Nurse Practitioner

## 2018-05-11 ENCOUNTER — Ambulatory Visit: Payer: No Typology Code available for payment source | Admitting: Nurse Practitioner

## 2018-05-11 DIAGNOSIS — E782 Mixed hyperlipidemia: Secondary | ICD-10-CM

## 2018-05-11 DIAGNOSIS — E118 Type 2 diabetes mellitus with unspecified complications: Secondary | ICD-10-CM

## 2018-05-11 MED ORDER — GLIMEPIRIDE 4 MG PO TABS: 4.0000 mg | ORAL_TABLET | Freq: Every day | ORAL | 0 refills | Status: DC

## 2018-05-11 MED ORDER — SIMVASTATIN 40 MG PO TABS: 40.0000 mg | ORAL_TABLET | Freq: Every day | ORAL | 0 refills | Status: DC

## 2018-05-11 NOTE — Telephone Encounter (Signed)
New Message   1) Medication(s) Requested (by name): Metformin, glimepiride (AMARYL) 4 MG tablet, lisinopril, simvastatin (ZOCOR) 40 MG tablet,   2) Pharmacy of Choice: [facility] in [place] on [address]  3) Special Requests: Pt's appt had to be rescheduled to May 4   Approved medications will be sent to the pharmacy, we will reach out if there is an issue.  Requests made after 3pm may not be addressed until the following business day!  If a patient is unsure of the name of the medication(s) please note and ask patient to call back when they are able to provide all info, do not send to responsible party until all information is available!

## 2018-05-12 NOTE — Telephone Encounter (Signed)
CMA attempt to reach patient to verify pharmacy patient would like to use

## 2018-05-24 ENCOUNTER — Telehealth: Payer: Self-pay | Admitting: Nurse Practitioner

## 2018-05-24 NOTE — Telephone Encounter (Signed)
New Message   Pt' daughter states the pt received a call requesting what pharmacy he wants his medication sent to. He wants it sent to the [facility] in [place] on [address]

## 2018-05-24 NOTE — Telephone Encounter (Signed)
[facility] to this [facility] 05/11/18.

## 2018-05-25 ENCOUNTER — Telehealth: Payer: Self-pay | Admitting: Nurse Practitioner

## 2018-05-25 NOTE — Telephone Encounter (Signed)
New Message   1) Medication(s) Requested (by name): Metformin  2) Pharmacy of Choice: [facility] in [place] on [address]  3) Special Requests:   Approved medications will be sent to the pharmacy, we will reach out if there is an issue.  Requests made after 3pm may not be addressed until the following business day!  If a patient is unsure of the name of the medication(s) please note and ask patient to call back when they are able to provide all info, do not send to responsible party until all information is available!

## 2018-05-26 NOTE — Telephone Encounter (Signed)
This patient should not be taking plain metformin any longer. The medication Janumet contains metformin in it. THEY SHOULD NOT BE TAKEN AT THE SAME TIME. Please make sure the patient stops taking plain metformin and continues to take the Janumet.

## 2018-05-27 MED FILL — !JANUMET 50-1000MG TABLET: 50-1000 | 30 days supply | Qty: 60 | Fill #3

## 2018-06-02 MED FILL — LISINOPRIL 20 MG TAB: 20 | 30 days supply | Qty: 30 | Fill #3

## 2018-06-13 ENCOUNTER — Ambulatory Visit: Payer: No Typology Code available for payment source | Admitting: Nurse Practitioner

## 2018-06-13 ENCOUNTER — Ambulatory Visit: Payer: No Typology Code available for payment source | Attending: Nurse Practitioner | Admitting: Nurse Practitioner

## 2018-06-13 ENCOUNTER — Encounter: Payer: Self-pay | Admitting: Nurse Practitioner

## 2018-06-13 ENCOUNTER — Other Ambulatory Visit: Payer: Self-pay

## 2018-06-13 VITALS — BP 142/87 | HR 108 | Temp 99.7°F | Ht 67.0 in | Wt 217.0 lb

## 2018-06-13 DIAGNOSIS — M79601 Pain in right arm: Secondary | ICD-10-CM

## 2018-06-13 DIAGNOSIS — E1165 Type 2 diabetes mellitus with hyperglycemia: Secondary | ICD-10-CM

## 2018-06-13 DIAGNOSIS — J029 Acute pharyngitis, unspecified: Secondary | ICD-10-CM

## 2018-06-13 DIAGNOSIS — I10 Essential (primary) hypertension: Secondary | ICD-10-CM

## 2018-06-13 DIAGNOSIS — E118 Type 2 diabetes mellitus with unspecified complications: Secondary | ICD-10-CM

## 2018-06-13 DIAGNOSIS — E782 Mixed hyperlipidemia: Secondary | ICD-10-CM

## 2018-06-13 DIAGNOSIS — M7732 Calcaneal spur, left foot: Secondary | ICD-10-CM | POA: Diagnosis not present

## 2018-06-13 LAB — POCT GLYCOSYLATED HEMOGLOBIN (HGB A1C): Hemoglobin A1C: 7.9 % — AB (ref 4.0–5.6)

## 2018-06-13 LAB — GLUCOSE, POCT (MANUAL RESULT ENTRY): POC Glucose: 209 mg/dl — AB (ref 70–99)

## 2018-06-13 MED ORDER — PENICILLIN V POTASSIUM 500 MG PO TABS: 500.0000 mg | ORAL_TABLET | Freq: Two times a day (BID) | ORAL | 0 refills | Status: DC

## 2018-06-13 MED ORDER — IBUPROFEN 600 MG PO TABS: 600.0000 mg | ORAL_TABLET | Freq: Three times a day (TID) | ORAL | 0 refills | Status: DC | PRN

## 2018-06-13 MED ORDER — CETIRIZINE HCL 10 MG PO TABS: 10.0000 mg | ORAL_TABLET | Freq: Every day | ORAL | 11 refills | Status: DC

## 2018-06-13 MED ORDER — SITAGLIPTIN PHOS-METFORMIN HCL 50-1000 MG PO TABS: 1.0000 | ORAL_TABLET | Freq: Two times a day (BID) | ORAL | 2 refills | Status: DC

## 2018-06-13 MED ORDER — METHOCARBAMOL 500 MG PO TABS: 500.0000 mg | ORAL_TABLET | Freq: Four times a day (QID) | ORAL | 1 refills | Status: DC

## 2018-06-13 MED ORDER — SIMVASTATIN 40 MG PO TABS: 40.0000 mg | ORAL_TABLET | Freq: Every day | ORAL | 0 refills | Status: DC

## 2018-06-13 MED ORDER — LISINOPRIL 20 MG PO TABS: 20.0000 mg | ORAL_TABLET | Freq: Every day | ORAL | 1 refills | Status: DC

## 2018-06-13 MED ORDER — GLIMEPIRIDE 4 MG PO TABS: 4.0000 mg | ORAL_TABLET | Freq: Every day | ORAL | 0 refills | Status: DC

## 2018-06-13 NOTE — Patient Instructions (Signed)
Espoln calcneo  Heel Spur    Los espolones calcneos son protuberancias seas que se forman en la parte inferior del hueso del taln (calcneo). Los espolones calcneos son frecuentes. A menudo causan inflamacin en la banda de tejido que conecta los dedos con el hueso del taln (fascia plantar). Esto puede causar dolor en la zona inferior del pie, cerca del taln. Muchas personas con fascitis plantar tambin presentan espolones calcneos. Sin embargo, los espolones no son la causa del dolor de la fascitis plantar.  Cules son las causas?  Se desconoce la causa exacta de los espolones calcneos. Las causas pueden ser las siguientes:   Presin sobre el hueso del taln.   Bandas de tejido (tendones) que tiran del hueso del taln.  Qu incrementa el riesgo?  Es ms probable que tenga esta afeccin si:   Es mayor de 40 aos.   Tiene sobrepeso.   Padece artritis debido al deterioro (artrosis).   Presenta inflamacin en la fascia plantar.   Participa en deportes o actividades que incluyen correr o saltar mucho.   Usa zapatos que no tienen buen calce.  Cules son los signos o los sntomas?  Algunas personas no tienen sntomas. Si tiene sntomas, pueden incluir los siguientes:   Dolor en la parte inferior del taln.   Dolor que empeora al levantarse de la cama por primera vez.   Dolor que empeora despus de caminar o ponerse de pie.  Cmo se diagnostica?  Esta afeccin se puede diagnosticar en funcin de lo siguiente:   Los sntomas y antecedentes mdicos.   Un examen fsico.   Una radiografa del pie.  Cmo se trata?  El tratamiento de esta afeccin depende de cunto dolor sienta. Las opciones de tratamiento pueden incluir las siguientes:   Hacer ejercicios de elongacin.   Bajar de peso, si es necesario.   Usar calzados o plantillas especficos dentro del calzado (aparatos ortopdicos) para sentir comodidad y apoyo.   Usar frulas en los pies mientras duerme. Las frulas mantienen los pies en una  posicin (generalmente a 90 grados) que debe prevenir y aliviar el dolor que siente al levantarse de la cama por primera vez. Adems, facilitan el estiramiento en la maana.   Tomar medicamentos de venta libre para aliviar el dolor, como antiinflamatorios no esteroideos (AINE).   Utilizar ondas sonoras de alta intensidad para fragmentar el espoln calcneo (terapia extracorprea por ondas de choque).   Recibir inyecciones de corticoesteroides en el taln para reducir la inflamacin.   Someterse a ciruga si el espoln calcneo causa dolor a largo plazo (crnico).  Siga estas indicaciones en su casa:    Actividad   Evite las actividades que le causen dolor hasta que se recupere o durante el tiempo que le haya indicado el mdico.   Realice ejercicios de estiramiento como se le indic. Elongue antes de hacer ejercicio o actividad fsica.  Control del dolor, la rigidez y la hinchazn   Si se lo indican, aplquese hielo sobre el pie:  ? Ponga el hielo en una bolsa plstica.  ? Coloque una toalla entre la piel y la bolsa de hielo.  ? Coloque el hielo durante 20minutos, de 2 a 3veces por da.   Mueva los dedos de los pies con frecuencia para evitar la rigidez y para reducir la hinchazn.   Cuando sea posible, levante (eleve) el pie por encima del nivel del corazn mientras est sentado o acostado.  Instrucciones generales   Tome los medicamentos de venta libre y   los recetados solamente como se lo haya indicado el mdico.   Use zapatos con buen apoyo y buen calce. Use las frulas, plantillas o aparatos ortopdicos como se lo haya indicado el mdico.   Si se lo recomiendan, consulte a su mdico para bajar de peso. Esto puede aliviar la presin en el pie.   No consuma ningn producto que contenga nicotina o tabaco, como cigarrillos y cigarrillos electrnicos. Estos productos pueden afectar el crecimiento seo y la cicatrizacin. Si necesita ayuda para dejar de fumar, consulte al mdico.   Concurra a todas las  visitas de control como se lo haya indicado el mdico. Esto es importante.  Comunquese con un mdico si:   El dolor no desaparece con el tratamiento.   El dolor empeora.  Resumen   Los espolones calcneos son protuberancias seas que se forman en la parte inferior del hueso del taln (calcneo).   Los espolones calcneos suelen causar inflamacin en la banda de tejido que conecta los dedos del pie con el hueso del taln (fascia plantar). Esto puede causar dolor en la zona inferior del pie, cerca del taln.   Hacer ejercicios de elongacin, bajar de peso, usar zapatos o plantillas especficos, usar frulas mientras duerme y tomar analgsicos puede aliviar el dolor y la rigidez.   Otras opciones de tratamiento pueden incluir ondas sonoras de alta intensidad para fragmentar el espoln calcneo, inyecciones con corticoesteroides y ciruga.  Esta informacin no tiene como fin reemplazar el consejo del mdico. Asegrese de hacerle al mdico cualquier pregunta que tenga.  Document Released: 11/12/2005 Document Revised: 03/16/2017 Document Reviewed: 03/16/2017  Elsevier Interactive Patient Education  2019 [employer] Inc[employer]

## 2018-06-13 NOTE — Progress Notes (Signed)
Assessment & Plan:  Lance Howell was seen today for follow-up.  Diagnoses and all orders for this visit:  Controlled type 2 diabetes mellitus with complication, without long-term current use of insulin (HCC) -     Glucose (CBG) -     HgB A1c -     sitaGLIPtin-metformin (JANUMET) 50-1000 MG tablet; Take 1 tablet by mouth 2 (two) times daily with a meal. -     glimepiride (AMARYL) 4 MG tablet; Take 1 tablet (4 mg total) by mouth daily with breakfast. Continue blood sugar control as discussed in office today, low carbohydrate diet, and regular physical exercise as tolerated, 150 minutes per week (30 min each day, 5 days per week, or 50 min 3 days per week). Keep blood sugar logs with fasting goal of 90-130 mg/dl, post prandial (after you eat) less than 180.  For Hypoglycemia: BS <60 and Hyperglycemia BS >400; contact the clinic ASAP. Annual eye exams and foot exams are recommended.   Essential hypertension -     lisinopril (ZESTRIL) 20 MG tablet; Take 1 tablet (20 mg total) by mouth daily. -     CBC -     Basic metabolic panel Continue all antihypertensives as prescribed.  Remember to bring in your blood pressure log with you for your follow up appointment.  DASH/Mediterranean Diets are healthier choices for HTN.    Mixed hyperlipidemia -     simvastatin (ZOCOR) 40 MG tablet; Take 1 tablet (40 mg total) by mouth daily. -     Lipid panel INSTRUCTIONS: Work on a low fat, heart healthy diet and participate in regular aerobic exercise program by working out at least 150 minutes per week; 5 days a week-30 minutes per day. Avoid red meat, fried foods. junk foods, sodas, sugary drinks, unhealthy snacking, alcohol and smoking.  Drink at least 48oz of water per day and monitor your carbohydrate intake daily.    Calcaneal spur, left -     Ambulatory referral to Podiatry -     ibuprofen (ADVIL) 600 MG tablet; Take 1 tablet (600 mg total) by mouth every 8 (eight) hours as needed.  Right arm pain -      methocarbamol (ROBAXIN) 500 MG tablet; Take 1 tablet (500 mg total) by mouth 4 (four) times daily for 14 days. -     ibuprofen (ADVIL) 600 MG tablet; Take 1 tablet (600 mg total) by mouth every 8 (eight) hours as needed.  Pharyngitis, unspecified etiology -     penicillin v potassium (VEETID) 500 MG tablet; Take 1 tablet (500 mg total) by mouth 2 (two) times daily for 10 days. -     cetirizine (ZYRTEC) 10 MG tablet; Take 1 tablet (10 mg total) by mouth daily.   Patient has been counseled on age-appropriate routine health concerns for screening and prevention. These are reviewed and up-to-date. Referrals have been placed accordingly. Immunizations are up-to-date or declined.    Subjective:   Chief Complaint  Patient presents with  . Follow-up    Pt. is here for diabetes follow-up. Pt. stated his right arm and right foot are giving him pain.    HPI Lance Howell 48 y.o. male presents to office today for follow up to HTN, DM and HPL. He also has complaints today of Right arm and right foot pain.    Hypertension He is not exercising and is adherent to low salt diet.  He does not have a blood pressure log  today.  He does not monitor his  blood pressure  at home. Blood pressure is elevated today. He endorses medication compliance taking lisinopril 20 mg daily. Also notes increased stress  Cardiac symptoms none. Denies chest pain, shortness of breath, palpitations, lightheadedness, dizziness, headaches or BLE edema.   Use of agents associated with hypertension: none.  History of target organ damage: none. BP Readings from Last 3 Encounters:  06/13/18 (!) 142/87  02/15/18 124/79  11/02/17 118/77    Hyperlipidemia Patient presents for follow up to hyperlipidemia.  He is medication compliant taking simvastatin 40 mg daily. He has not been diet compliant recently due to decreased hours at work, staying at home more which has led to increased poor eating habits. He denies statin intolerance  including myalgias.  Lab Results  Component Value Date   CHOL 159 04/02/2017   Lab Results  Component Value Date   HDL 52 04/02/2017   Lab Results  Component Value Date   LDLCALC 80 04/02/2017   Lab Results  Component Value Date   TRIG 134 04/02/2017   Lab Results  Component Value Date   CHOLHDL 3.1 04/02/2017     Diabetes Mellitus Type II Current symptoms/problems include none. Known diabetic complications: none Cardiovascular risk factors: diabetes mellitus, dyslipidemia, hypertension, male gender and obesity (BMI >= 30 kg/m2) Current diabetic medications include: janumet 50-1000 mg BID Eye exam current (within one year): no Weight trend: stable Prior visit with dietician: no Current monitoring regimen: home blood tests - 2 times daily Home blood sugar records: fasting range: 170s Any episodes of hypoglycemia? no Is He on ACE inhibitor or angiotensin II receptor blocker?  Yes  Lab Results  Component Value Date   HGBA1C 7.9 (A) 06/13/2018   HGBA1C 7.3 (A) 02/15/2018   HGBA1C 7.8 (A) 11/02/2017     Right Arm  Worse with activity. Described as sharp as throbbing. A month ago he was using a heavy hammer the entire day at work and started to notice a pain in his lower right arm which seems to have gotten worse over the past few weeks. He is right hand dominant. He has not taken any medications for pain relief.   Left Foot Pain He has a calcaneal spur in the left heel. Was referred to podiatry in January but did not schedule an appointment when they call. Now states his hours at work have been cut so not sure about his finances. I will place referral and have instructed him to discuss payment options with podiatry when they call to schedule.    Sore Throat Patient complains of sore throat. Associated symptoms include dry cough, clear nasal discharge, pain while swallowing, post nasal drip, sinus and nasal congestion, sore throat and white spots in throat.Onset of  symptoms was several days ago, unchanged since that time. He is drinking plenty of fluids. He has not had recent close exposure to someone with proven streptococcal pharyngitis. He does have a history of allergies as well and currently not taking any allergy medication.   Review of Systems  Constitutional: Negative for fever, malaise/fatigue and weight loss.  HENT: Positive for congestion and sore throat. Negative for nosebleeds.   Eyes: Negative.  Negative for blurred vision, double vision and photophobia.  Respiratory: Negative.  Negative for cough and shortness of breath.   Cardiovascular: Negative.  Negative for chest pain, palpitations and leg swelling.  Gastrointestinal: Negative.  Negative for heartburn, nausea and vomiting.  Musculoskeletal: Positive for joint pain and myalgias.       SEE HPI  Neurological: Negative.  Negative for dizziness, focal weakness, seizures and headaches.  Endo/Heme/Allergies: Positive for environmental allergies.  Psychiatric/Behavioral: Negative.  Negative for suicidal ideas.    Past Medical History:  Diagnosis Date  . Diabetes mellitus without complication ([place])   . Hyperlipidemia   . Hypertension     Past Surgical History:  Procedure Laterality Date  . APPENDECTOMY    . kidney stone removal      Family History  Problem Relation Age of Onset  . Hypertension Mother   . Diabetes Father     Social History Reviewed with no changes to be made today.   Outpatient Medications Prior to Visit  Medication Sig Dispense Refill  . Blood Glucose Monitoring Suppl (TRUE METRIX METER) w/Device KIT Use as instructed. 1 kit 0  . glucose blood test strip Use as instructed 100 each 12  . TRUEPLUS LANCETS 28G MISC Use as instructed 100 each 12  . glimepiride (AMARYL) 4 MG tablet Take 1 tablet (4 mg total) by mouth daily with breakfast. 90 tablet 0  . lisinopril (PRINIVIL,ZESTRIL) 20 MG tablet Take 1 tablet (20 mg total) by mouth daily. 90 tablet 1  .  simvastatin (ZOCOR) 40 MG tablet Take 1 tablet (40 mg total) by mouth daily. 90 tablet 0  . sitaGLIPtin-metformin (JANUMET) 50-1000 MG tablet Take 1 tablet by mouth 2 (two) times daily with a meal. 180 tablet 2   No facility-administered medications prior to visit.     No Known Allergies     Objective:    BP (!) 142/87 (BP Location: Left Arm, Patient Position: Sitting, Cuff Size: Large)   Pulse (!) 108   Temp 99.7 F (37.6 C) (Oral)   Ht 5' 7" (1.702 m)   Wt 217 lb (98.4 kg)   SpO2 95%   BMI 33.99 kg/m  Wt Readings from Last 3 Encounters:  06/13/18 217 lb (98.4 kg)  02/15/18 215 lb 6.4 oz (97.7 kg)  11/02/17 221 lb 12.8 oz (100.6 kg)    Physical Exam Vitals signs and nursing note reviewed.  Constitutional:      Appearance: He is well-developed.  HENT:     Head: Normocephalic and atraumatic.     Nose: Mucosal edema and rhinorrhea present. Rhinorrhea is clear.     Right Turbinates: Swollen and pale.     Left Turbinates: Swollen and pale.     Mouth/Throat:     Pharynx: Pharyngeal swelling, oropharyngeal exudate and posterior oropharyngeal erythema present. No uvula swelling.     Comments: Patient is s/p tonsillectomy Neck:     Musculoskeletal: Normal range of motion.     Thyroid: No thyromegaly or thyroid tenderness.     Trachea: Trachea normal.  Cardiovascular:     Rate and Rhythm: Regular rhythm. Tachycardia present.     Heart sounds: Normal heart sounds. No murmur. No friction rub. No gallop.   Pulmonary:     Effort: Pulmonary effort is normal. No tachypnea or respiratory distress.     Breath sounds: Normal breath sounds. No decreased breath sounds, wheezing, rhonchi or rales.  Chest:     Chest wall: No tenderness.  Abdominal:     General: Bowel sounds are normal.     Palpations: Abdomen is soft.  Musculoskeletal:        General: Swelling present.     Right elbow: He exhibits swelling. Tenderness found. Medial epicondyle tenderness noted.       Arms:   Lymphadenopathy:     Cervical: Cervical adenopathy present.  Skin:    General: Skin is warm and dry.  Neurological:     Mental Status: He is alert and oriented to person, place, and time.     Coordination: Coordination normal.  Psychiatric:        Behavior: Behavior normal. Behavior is cooperative.        Thought Content: Thought content normal.        Judgment: Judgment normal.        Patient has been counseled extensively about nutrition and exercise as well as the importance of adherence with medications and regular follow-up. The patient was given clear instructions to go to ER or return to medical center if symptoms don't improve, worsen or new problems develop. The patient verbalized understanding.   Follow-up: Return in about 3 weeks (around 07/04/2018) for right arm pain.   Gildardo Pounds, FNP-BC [facility] [place], [place]   06/13/2018, 9:46 AM

## 2018-06-14 LAB — LIPID PANEL
Chol/HDL Ratio: 3.3 ratio (ref 0.0–5.0)
Cholesterol, Total: 139 mg/dL (ref 100–199)
HDL: 42 mg/dL (ref >39)
LDL Calculated: 73 mg/dL (ref 0–99)
Triglycerides: 118 mg/dL (ref 0–149)
VLDL Cholesterol Cal: 24 mg/dL (ref 5–40)

## 2018-06-14 LAB — CBC
Hematocrit: 47.3 % (ref 37.5–51.0)
Hemoglobin: 16 g/dL (ref 13.0–17.7)
MCH: 29.8 pg (ref 26.6–33.0)
MCHC: 33.8 g/dL (ref 31.5–35.7)
MCV: 88 fL (ref 79–97)
Platelets: 223 10*3/uL (ref 150–450)
RBC: 5.37 x10E6/uL (ref 4.14–5.80)
RDW: 12.9 % (ref 11.6–15.4)
WBC: 7.1 10*3/uL (ref 3.4–10.8)

## 2018-06-14 LAB — BASIC METABOLIC PANEL
BUN/Creatinine Ratio: 13 (ref 9–20)
BUN: 13 mg/dL (ref 6–24)
CO2: 20 mmol/L (ref 20–29)
Calcium: 9.7 mg/dL (ref 8.7–10.2)
Chloride: 100 mmol/L (ref 96–106)
Creatinine, Ser: 0.98 mg/dL (ref 0.76–1.27)
GFR calc Af Amer: 106 mL/min/{1.73_m2} (ref >59)
GFR calc non Af Amer: 92 mL/min/{1.73_m2} (ref >59)
Glucose: 196 mg/dL — ABNORMAL HIGH (ref 65–99)
Potassium: 4.3 mmol/L (ref 3.5–5.2)
Sodium: 139 mmol/L (ref 134–144)

## 2018-06-15 ENCOUNTER — Telehealth: Payer: Self-pay | Admitting: Nurse Practitioner

## 2018-06-15 DIAGNOSIS — J029 Acute pharyngitis, unspecified: Secondary | ICD-10-CM

## 2018-06-15 DIAGNOSIS — M7732 Calcaneal spur, left foot: Secondary | ICD-10-CM

## 2018-06-15 DIAGNOSIS — E118 Type 2 diabetes mellitus with unspecified complications: Secondary | ICD-10-CM

## 2018-06-15 DIAGNOSIS — M79601 Pain in right arm: Secondary | ICD-10-CM

## 2018-06-15 NOTE — Telephone Encounter (Signed)
1) Medication(s) Requested (by name): Glimepiride Ibuprofen Lisinopril Methocarbamol Penicillin Simvastatin janumet Lancets Test strips 2) Pharmacy of Choice: [facility] Was told by pharmacy pcp office has to send it   3) Special Requests:   Approved medications will be sent to the pharmacy, we will reach out if there is an issue.  Requests made after 3pm may not be addressed until the following business day!  If a patient is unsure of the name of the medication(s) please note and ask patient to call back when they are able to provide all info, do not send to responsible party until all information is available!

## 2018-06-17 ENCOUNTER — Telehealth: Payer: Self-pay | Admitting: Nurse Practitioner

## 2018-06-17 ENCOUNTER — Telehealth: Payer: Self-pay

## 2018-06-17 MED ORDER — GLUCOSE BLOOD VI STRP: ORAL_STRIP | 12 refills | Status: DC

## 2018-06-17 MED ORDER — METHOCARBAMOL 500 MG PO TABS: 500.0000 mg | ORAL_TABLET | Freq: Four times a day (QID) | ORAL | 1 refills | Status: AC

## 2018-06-17 MED ORDER — TRUEPLUS LANCETS 28G MISC: 12 refills | Status: DC

## 2018-06-17 MED ORDER — PENICILLIN V POTASSIUM 500 MG PO TABS: 500.0000 mg | ORAL_TABLET | Freq: Two times a day (BID) | ORAL | 0 refills | Status: AC

## 2018-06-17 MED ORDER — IBUPROFEN 600 MG PO TABS: 600.0000 mg | ORAL_TABLET | Freq: Three times a day (TID) | ORAL | 0 refills | Status: DC | PRN

## 2018-06-17 MED FILL — IBUPROFEN 600 MG TABLET: 600 | 20 days supply | Qty: 60 | Fill #0

## 2018-06-17 MED FILL — METHOCARBAMOL 500 MG TABS: 500 | 14 days supply | Qty: 56 | Fill #0

## 2018-06-17 MED FILL — TRUEplus LANCETS 28G MISC: 25 days supply | Qty: 100 | Fill #0

## 2018-06-17 MED FILL — SIMVASTATIN 40 MG TABLET: 40 | 30 days supply | Qty: 30 | Fill #3

## 2018-06-17 MED FILL — TRUE METRIX GLUCOSE TEST ST: 25 days supply | Qty: 100 | Fill #0

## 2018-06-17 MED FILL — PENICILLIN VK 500 MG TABLET: 500 | 10 days supply | Qty: 20 | Fill #0

## 2018-06-17 MED FILL — GLIMEPIRIDE 4 MG TABS: 4 | 30 days supply | Qty: 30 | Fill #3

## 2018-06-17 NOTE — Telephone Encounter (Signed)
-----   Message from Claiborne Rigg, NP sent at 06/14/2018  6:41 PM EDT ----- All of your labs look great!!! Cholesterol levels are normal. There is no anemia. Kidney and liver function are normal as well. Continue on same plan.

## 2018-06-17 NOTE — Telephone Encounter (Signed)
CMA attempt to reach patient to inform on results.  No answer and left a VM for a call back.  

## 2018-06-17 NOTE — Telephone Encounter (Signed)
Patient called to get their lab results. Please follow up.  °

## 2018-06-17 NOTE — Telephone Encounter (Signed)
Resent all RX's sent on 06/13/18 to [facility] after confirming they had not been picked up from [facility], the prescriptions were deleted by [facility].

## 2018-06-17 NOTE — Telephone Encounter (Signed)
Patient contacted via phone to be given results of labs.  Patient identified by name and date of birth.  Patient given results of labs.  Patient educated on lab results. Questions answered. Patient acknowledged understanding of labs results. 

## 2018-06-27 ENCOUNTER — Telehealth: Payer: Self-pay | Admitting: Nurse Practitioner

## 2018-06-27 ENCOUNTER — Other Ambulatory Visit: Payer: Self-pay | Admitting: Nurse Practitioner

## 2018-06-27 NOTE — Telephone Encounter (Signed)
CMA spoke to patient with a spanish pacific interpreter.   Pt. Is still taking his diabetes, cholesterol, and BP medication. He stated he stopped taking Penicillin V Potassium and Methocarbamol due to making him feel sick and was out out work since Friday. Pt. Is asking for a work note for him to return to work tomorrow.

## 2018-06-27 NOTE — Telephone Encounter (Signed)
CMA informed patient.

## 2018-06-27 NOTE — Telephone Encounter (Signed)
He can pick up his letter tomorrow.

## 2018-06-27 NOTE — Telephone Encounter (Signed)
Patients call taken.  Patient identified by name and date of birth.  Patient states that he got new medication on Friday.  Patient states that he feels he had a reaction to the new medication but he is not sure which one.    Patient states that he has has intermittent chest pain, vomiting, stomach pain, and dizziness.  Patient states he went to the ED on Saturday and was tested for COVID_19 which patient states was negative.  Patient denies other COVID-19 symptoms.  Patient has stopped taking medications as of Sunday 06/26/2018.   Patient advised to hold on medication until a return call is made.  Patient wants a work note.  Patient acknowledged understanding of advice.

## 2018-06-27 NOTE — Telephone Encounter (Signed)
Patient is to continue to take all of his diabetes medications. He should not stop his diabetes meds. All other medications and lisinopril for blood pressure and simvistatin for cholesterol

## 2018-06-27 NOTE — Telephone Encounter (Signed)
Patient called stating he believes he is having a reaction to the medication he was prescribed. Patient states he is vomiting and has chest pain and feels dizzy. Please follow up.  Advised to go to the ED or UC

## 2018-06-29 MED FILL — !JANUMET 50-1000MG TABLET: 50-1000 | 30 days supply | Qty: 60 | Fill #4

## 2018-06-29 MED FILL — LISINOPRIL 20 MG TAB: 20 | 30 days supply | Qty: 30 | Fill #4

## 2018-07-08 ENCOUNTER — Ambulatory Visit: Payer: No Typology Code available for payment source | Admitting: Nurse Practitioner

## 2018-07-29 MED FILL — !JANUMET 50-1000MG TABLET: 50-1000 | 30 days supply | Qty: 60 | Fill #5

## 2018-07-29 MED FILL — LISINOPRIL 20 MG TABLET: 20 | 30 days supply | Qty: 30 | Fill #5

## 2018-08-08 ENCOUNTER — Encounter: Payer: Self-pay | Admitting: Nurse Practitioner

## 2018-08-08 ENCOUNTER — Ambulatory Visit: Payer: No Typology Code available for payment source | Attending: Nurse Practitioner | Admitting: Nurse Practitioner

## 2018-08-08 DIAGNOSIS — Z87442 Personal history of urinary calculi: Secondary | ICD-10-CM | POA: Diagnosis not present

## 2018-08-08 DIAGNOSIS — R109 Unspecified abdominal pain: Secondary | ICD-10-CM

## 2018-08-08 DIAGNOSIS — R300 Dysuria: Secondary | ICD-10-CM

## 2018-08-08 MED ORDER — TAMSULOSIN HCL 0.4 MG PO CAPS: 0.4000 mg | ORAL_CAPSULE | Freq: Every day | ORAL | 0 refills | Status: DC

## 2018-08-08 MED ORDER — ACETAMINOPHEN-CODEINE #3 300-30 MG PO TABS: 1.0000 | ORAL_TABLET | Freq: Three times a day (TID) | ORAL | 0 refills | Status: AC | PRN

## 2018-08-08 NOTE — Progress Notes (Signed)
Virtual Visit via Telephone Note Due to national recommendations of social distancing due to COVID 19, telehealth visit is felt to be most appropriate for this patient at this time.  I discussed the limitations, risks, security and privacy concerns of performing an evaluation and management service by telephone and the availability of in person appointments. I also discussed with the patient that there may be a patient responsible charge related to this service. The patient expressed understanding and agreed to proceed.    I connected with Lance Howell on 08/08/18  at   9:50 AM EDT  EDT by telephone and verified that I am speaking with the correct person using two identifiers.   Consent I discussed the limitations, risks, security and privacy concerns of performing an evaluation and management service by telephone and the availability of in person appointments. I also discussed with the patient that there may be a patient responsible charge related to this service. The patient expressed understanding and agreed to proceed.   Location of Patient: Private Residence   [facility]    Persons participating in Telemedicine visit: Lance DenverZelda Olyn Landstrom FNP-BC [facility] Lance Howell  Spanish interpreter  Lance HumphreyRamiro ID#  [id number]   History of Present Illness: Telemedicine visit for: Follow up to kidney stones.    Urolithiasis: Patient complains of left abdominal pain without radiation to the groin and testicles.  States he was recently diagnosed with kidney stones on May 28th. He was prescribed Flomax and Norco for pain from the Urgent care that he went to in [place]. States pain has continued since then.  Patient describes the pain as aching, colicky, sharp and stabbing, intermittent and rated as moderate. The patient has had no nausea and no vomiting and no diaphoresis. There has been no fever or chills. The patient is not complaining of  frequency. There is a history of kidney stones in the past. He does endorse dysuria.     Also states he tested positive for COVID-19 May 7th [facility]. Was retested around the end of May and states was negative. Currently asymptomatic for COVID symptoms.     Past Medical History:  Diagnosis Date  . Diabetes mellitus without complication (HCC)   . Hyperlipidemia   . Hypertension     Past Surgical History:  Procedure Laterality Date  . APPENDECTOMY    . kidney stone removal      Family History  Problem Relation Age of Onset  . Hypertension Mother   . Diabetes Father     Social History   Socioeconomic History  . Marital status: Married    Spouse name: Not on file  . Number of children: Not on file  . Years of education: Not on file  . Highest education level: Not on file  Occupational History  . Not on file  Social Needs  . Financial resource strain: Not on file  . Food insecurity    Worry: Not on file    Inability: Not on file  . Transportation needs    Medical: Not on file    Non-medical: Not on file  Tobacco Use  . Smoking status: Never Smoker  . Smokeless tobacco: Never Used  Substance and Sexual Activity  . Alcohol use: Yes    Comment: occasionally   . Drug use: No  . Sexual activity: Yes  Lifestyle  . Physical activity    Days per week: Not on file  Minutes per session: Not on file  . Stress: Not on file  Relationships  . Social Herbalist on phone: Not on file    Gets together: Not on file    Attends religious service: Not on file    Active member of club or organization: Not on file    Attends meetings of clubs or organizations: Not on file    Relationship status: Not on file  Other Topics Concern  . Not on file  Social History Narrative  . Not on file     Observations/Objective: Awake, alert and oriented x 3   Review of Systems  Constitutional: Negative for fever, malaise/fatigue and weight loss.   HENT: Negative.  Negative for nosebleeds.   Eyes: Negative.  Negative for blurred vision, double vision and photophobia.  Respiratory: Negative.  Negative for cough and shortness of breath.   Cardiovascular: Negative.  Negative for chest pain, palpitations and leg swelling.  Gastrointestinal: Positive for abdominal pain. Negative for heartburn, nausea and vomiting.  Genitourinary: Positive for dysuria and flank pain. Negative for frequency, hematuria and urgency.  Musculoskeletal: Negative for myalgias.  Neurological: Negative.  Negative for dizziness, focal weakness, seizures and headaches.  Psychiatric/Behavioral: Negative.  Negative for suicidal ideas.    Assessment and Plan: Lance Howell was evaluated today for follow-up.  Diagnoses and all orders for this visit:  Flank pain with history of urolithiasis -     tamsulosin (FLOMAX) 0.4 MG CAPS capsule; Take 1 capsule (0.4 mg total) by mouth daily for 30 days. -     CT RENAL STONE STUDY; Future -     acetaminophen-codeine (TYLENOL #3) 300-30 MG tablet; Take 1-2 tablets by mouth every 8 (eight) hours as needed for up to 14 days for moderate pain.     Follow Up Instructions Return in about 2 months (around 10/08/2018) for HTN/HPL/DM.     I discussed the assessment and treatment plan with the patient. The patient was provided an opportunity to ask questions and all were answered. The patient agreed with the plan and demonstrated an understanding of the instructions.   The patient was advised to call back or seek an in-person evaluation if the symptoms worsen or if the condition fails to improve as anticipated.  I provided 24 minutes of non-face-to-face time during this encounter including median intraservice time, reviewing previous notes, labs, imaging, medications and explaining diagnosis and management.  Gildardo Pounds, FNP-BC

## 2018-08-29 MED FILL — GLIMEPIRIDE 4 MG TABS: 4 | 30 days supply | Qty: 30 | Fill #4

## 2018-08-29 MED FILL — SIMVASTATIN 40 MG TABLET: 40 | 30 days supply | Qty: 30 | Fill #4

## 2018-08-29 MED FILL — !JANUMET 50-1000MG TABLET: 50-1000 | 30 days supply | Qty: 60 | Fill #0

## 2018-08-29 MED FILL — LISINOPRIL 20 MG TABLET: 20 | 90 days supply | Qty: 90 | Fill #0

## 2018-09-05 ENCOUNTER — Other Ambulatory Visit: Payer: Self-pay | Admitting: Nurse Practitioner

## 2018-09-05 DIAGNOSIS — R109 Unspecified abdominal pain: Secondary | ICD-10-CM

## 2018-09-12 ENCOUNTER — Telehealth: Payer: Self-pay | Admitting: Nurse Practitioner

## 2018-09-12 NOTE — Telephone Encounter (Signed)
Reschedule for September. If symptoms worsen needs to go to Urgent care, ED or be tested at [facility] for [place]

## 2018-09-12 NOTE — Telephone Encounter (Signed)
Daughter Mickel Baas called to say pt had COVID-19 + at [facility]-thru in April. Now pt is having cold-like symptoms of cough, runny nose, no fever, no other symptoms. Pt thinks it is from working outside in heat then coming inside to [facility]. Pt canceled dr appt 09/14/18 for A1c due to symptoms.  Please advise if need another COVID test or if OK to resched appt for dr and/or labs. [phone] Mickel Baas.

## 2018-09-14 ENCOUNTER — Ambulatory Visit: Payer: No Typology Code available for payment source | Admitting: Nurse Practitioner

## 2018-10-03 MED FILL — !JANUMET 50-1000MG TABLET: 50-1000 | 30 days supply | Qty: 60 | Fill #1

## 2018-10-03 MED FILL — SIMVASTATIN 40 MG TABLET: 40 | 30 days supply | Qty: 30 | Fill #5

## 2018-10-03 MED FILL — GLIMEPIRIDE 4 MG TABS: 4 | 30 days supply | Qty: 30 | Fill #5

## 2018-10-24 ENCOUNTER — Encounter: Payer: Self-pay | Admitting: Nurse Practitioner

## 2018-10-24 ENCOUNTER — Ambulatory Visit: Payer: Self-pay | Attending: Nurse Practitioner | Admitting: Nurse Practitioner

## 2018-10-24 ENCOUNTER — Other Ambulatory Visit: Payer: Self-pay

## 2018-10-24 VITALS — BP 119/79 | HR 65 | Temp 98.0°F | Ht 67.0 in | Wt 213.4 lb

## 2018-10-24 DIAGNOSIS — M79601 Pain in right arm: Secondary | ICD-10-CM

## 2018-10-24 DIAGNOSIS — E118 Type 2 diabetes mellitus with unspecified complications: Secondary | ICD-10-CM

## 2018-10-24 DIAGNOSIS — E782 Mixed hyperlipidemia: Secondary | ICD-10-CM

## 2018-10-24 DIAGNOSIS — J302 Other seasonal allergic rhinitis: Secondary | ICD-10-CM

## 2018-10-24 DIAGNOSIS — I10 Essential (primary) hypertension: Secondary | ICD-10-CM

## 2018-10-24 DIAGNOSIS — R05 Cough: Secondary | ICD-10-CM

## 2018-10-24 DIAGNOSIS — M7732 Calcaneal spur, left foot: Secondary | ICD-10-CM

## 2018-10-24 DIAGNOSIS — R058 Other specified cough: Secondary | ICD-10-CM

## 2018-10-24 LAB — POCT GLYCOSYLATED HEMOGLOBIN (HGB A1C): Hemoglobin A1C: 7.3 % — AB (ref 4.0–5.6)

## 2018-10-24 LAB — GLUCOSE, POCT (MANUAL RESULT ENTRY): POC Glucose: 174 mg/dl — AB (ref 70–99)

## 2018-10-24 MED ORDER — CETIRIZINE HCL 10 MG PO TABS: 10.0000 mg | ORAL_TABLET | Freq: Every day | ORAL | 3 refills | Status: DC

## 2018-10-24 MED ORDER — IBUPROFEN 600 MG PO TABS: 600.0000 mg | ORAL_TABLET | Freq: Three times a day (TID) | ORAL | 0 refills | Status: DC | PRN

## 2018-10-24 MED ORDER — GLIMEPIRIDE 4 MG PO TABS: 4.0000 mg | ORAL_TABLET | Freq: Every day | ORAL | 0 refills | Status: DC

## 2018-10-24 MED ORDER — GLUCOSE BLOOD VI STRP: ORAL_STRIP | 12 refills | Status: DC

## 2018-10-24 MED ORDER — TRUEPLUS LANCETS 28G MISC: 12 refills | Status: DC

## 2018-10-24 MED ORDER — LISINOPRIL 20 MG PO TABS: 20.0000 mg | ORAL_TABLET | Freq: Every day | ORAL | 1 refills | Status: DC

## 2018-10-24 MED ORDER — SITAGLIPTIN PHOS-METFORMIN HCL 50-1000 MG PO TABS: 1.0000 | ORAL_TABLET | Freq: Two times a day (BID) | ORAL | 2 refills | Status: DC

## 2018-10-24 MED ORDER — SIMVASTATIN 40 MG PO TABS: 40.0000 mg | ORAL_TABLET | Freq: Every day | ORAL | 0 refills | Status: DC

## 2018-10-24 MED ORDER — BENZONATATE 200 MG PO CAPS: 200.0000 mg | ORAL_CAPSULE | Freq: Two times a day (BID) | ORAL | 1 refills | Status: DC | PRN

## 2018-10-24 MED FILL — IBUPROFEN 600 MG TABLET: 600 | 20 days supply | Qty: 60 | Fill #0

## 2018-10-24 MED FILL — SIMVASTATIN 40 MG TABLET: 40 | 90 days supply | Qty: 90 | Fill #0

## 2018-10-24 MED FILL — TRUE METRIX TEST STRIP: 25 days supply | Qty: 100 | Fill #0

## 2018-10-24 MED FILL — TRUEplus LANCETS 28G MISC: 90 days supply | Qty: 200 | Fill #0

## 2018-10-24 NOTE — Progress Notes (Signed)
Assessment & Plan:  Lance Howell was seen today for follow-up.  Diagnoses and all orders for this visit:  Controlled type 2 diabetes mellitus with complication, without long-term current use of insulin (HCC) -     Glucose (CBG) -     HgB A1c -     glucose blood test strip; Use as instructed. Check blood glucose level by fingerstick twice per day. PLEASE MAIL. 90 day supply -     sitaGLIPtin-metformin (JANUMET) 50-1000 MG tablet; Take 1 tablet by mouth 2 (two) times daily with a meal. PLEASE MAIL. 90 day supply -     TRUEplus Lancets 28G MISC; Use as instructed. Check blood glucose level by fingerstick twice per day.PLEASE MAIL. 90 day supply -     glimepiride (AMARYL) 4 MG tablet; Take 1 tablet (4 mg total) by mouth daily with breakfast. PLEASE MAIL. 90 day supply Continue blood sugar control as discussed in office today, low carbohydrate diet, and regular physical exercise as tolerated, 150 minutes per week (30 min each day, 5 days per week, or 50 min 3 days per week). Keep blood sugar logs with fasting goal of 90-130 mg/dl, post prandial (after you eat) less than 180.  For Hypoglycemia: BS <60 and Hyperglycemia BS >400; contact the clinic ASAP. Annual eye exams and foot exams are recommended.   Mixed hyperlipidemia -     simvastatin (ZOCOR) 40 MG tablet; Take 1 tablet (40 mg total) by mouth daily. PLEASE MAIL. 90 day supply -     Lipid Panel INSTRUCTIONS: Work on a low fat, heart healthy diet and participate in regular aerobic exercise program by working out at least 150 minutes per week; 5 days a week-30 minutes per day. Avoid red meat, fried foods. junk foods, sodas, sugary drinks, unhealthy snacking, alcohol and smoking.  Drink at least 48oz of water per day and monitor your carbohydrate intake daily.    Essential hypertension -     lisinopril (ZESTRIL) 20 MG tablet; Take 1 tablet (20 mg total) by mouth daily. PLEASE MAIL. 90 day supply Continue all antihypertensives as prescribed.   Remember to bring in your blood pressure log with you for your follow up appointment.  DASH/Mediterranean Diets are healthier choices for HTN.    Calcaneal spur, left -     ibuprofen (ADVIL) 600 MG tablet; Take 1 tablet (600 mg total) by mouth every 8 (eight) hours as needed.  Right arm pain -     ibuprofen (ADVIL) 600 MG tablet; Take 1 tablet (600 mg total) by mouth every 8 (eight) hours as needed.  Seasonal allergies -     cetirizine (ZYRTEC) 10 MG tablet; Take 1 tablet (10 mg total) by mouth daily. PLEASE MAIL. 90 day supply  Cough present for greater than 3 weeks -     benzonatate (TESSALON) 200 MG capsule; Take 1 capsule (200 mg total) by mouth 2 (two) times daily as needed for cough. PLEASE MAIL. -     cetirizine (ZYRTEC) 10 MG tablet; Take 1 tablet (10 mg total) by mouth daily. PLEASE MAIL. 90 day supply    Patient has been counseled on age-appropriate routine health concerns for screening and prevention. These are reviewed and up-to-date. Referrals have been placed accordingly. Immunizations are up-to-date or declined.    Subjective:   Chief Complaint  Patient presents with  . Follow-up    Pt. is here to follow up on diabetes.    HPI Lance Howell 48 y.o. male presents to office for follow  up to DM, HTN and HPL   DM TYPE 2 Taking Janumet 50-1000 mg BID.  Denies any hypo or hyperglycemic symptoms.  Only checking blood glucose levels on Friday and Saturdays. States he is out of town working in [employer] during the week and does not check his blood glucose levels while he is away from home.  A1c has improved however still not at goal.  Will start patient on glimepiride 4 mg daily.  He denies any hypo or hyperglycemic symptoms. Taking Statin and ACE as prescribed.  Lab Results  Component Value Date   HGBA1C 7.3 (A) 10/24/2018   Lab Results  Component Value Date   HGBA1C 7.9 (A) 06/13/2018    Essential Hypertension Well-controlled.  He does not monitor his blood pressure  at home. He currently is taking lisinopril 20 mg daily.   He endorses medication compliance taking lisinopril 20 mg daily. Denies chest pain, shortness of breath, palpitations, lightheadedness, dizziness, headaches or BLE edema.  BP Readings from Last 3 Encounters:  10/24/18 119/79  06/13/18 (!) 142/87  02/15/18 124/79    Mixed Hyperlipidemia At goal of less than 70.  He endorses medication compliance taking Zocor 40 mg daily as prescribed.  He denies any statin intolerance or myalgias.  Lab Results  Component Value Date   LDLCALC 73 06/13/2018    Cough: Patient complains of nonproductive cough.  Symptoms began several weeks ago.  The cough is non-productive, without wheezing, dyspnea or hemoptysis and is aggravated by cold air and pollens Associated symptoms include:none. Patient does not have new pets. Patient does not have a history of asthma. Patient does have a history of environmental allergens. Patient did not have recent travel. Patient does not have a history of smoking. Patient  does not have previous Chest X-ray. Patient denies TB exposure or previous history of.    Review of Systems  Constitutional: Negative for fever, malaise/fatigue and weight loss.  HENT: Negative.  Negative for nosebleeds.   Eyes: Negative.  Negative for blurred vision, double vision and photophobia.  Respiratory: Positive for cough and wheezing. Negative for hemoptysis, sputum production and shortness of breath.   Cardiovascular: Negative.  Negative for chest pain, palpitations and leg swelling.  Gastrointestinal: Negative.  Negative for heartburn, nausea and vomiting.  Musculoskeletal: Positive for joint pain. Negative for myalgias.  Neurological: Negative.  Negative for dizziness, focal weakness, seizures and headaches.  Endo/Heme/Allergies: Positive for environmental allergies.  Psychiatric/Behavioral: Negative.  Negative for suicidal ideas.    Past Medical History:  Diagnosis Date  . Diabetes  mellitus without complication ([place])   . Hyperlipidemia   . Hypertension     Past Surgical History:  Procedure Laterality Date  . APPENDECTOMY    . kidney stone removal      Family History  Problem Relation Age of Onset  . Hypertension Mother   . Diabetes Father     Social History Reviewed with no changes to be made today.   Outpatient Medications Prior to Visit  Medication Sig Dispense Refill  . Blood Glucose Monitoring Suppl (TRUE METRIX METER) w/Device KIT Use as instructed. 1 kit 0  . cetirizine (ZYRTEC) 10 MG tablet Take 1 tablet (10 mg total) by mouth daily. 30 tablet 11  . glucose blood test strip Use as instructed 100 each 12  . lisinopril (ZESTRIL) 20 MG tablet Take 1 tablet (20 mg total) by mouth daily. 90 tablet 1  . simvastatin (ZOCOR) 40 MG tablet Take 1 tablet (40 mg total) by mouth  daily. 90 tablet 0  . sitaGLIPtin-metformin (JANUMET) 50-1000 MG tablet Take 1 tablet by mouth 2 (two) times daily with a meal. 180 tablet 2  . TRUEplus Lancets 28G MISC Use as instructed 100 each 12  . tamsulosin (FLOMAX) 0.4 MG CAPS capsule TAKE ONE CAPSULE BY MOUTH DAILY (Patient not taking: Reported on 10/24/2018) 30 capsule 0  . glimepiride (AMARYL) 4 MG tablet Take 1 tablet (4 mg total) by mouth daily with breakfast. 90 tablet 0  . ibuprofen (ADVIL) 600 MG tablet Take 1 tablet (600 mg total) by mouth every 8 (eight) hours as needed. 60 tablet 0   No facility-administered medications prior to visit.     No Known Allergies     Objective:    BP 119/79 (BP Location: Left Arm, Patient Position: Sitting, Cuff Size: Large)   Pulse 65   Temp 98 F (36.7 C) (Oral)   Ht 5' 7"  (1.702 m)   Wt 213 lb 6.4 oz (96.8 kg)   SpO2 99%   BMI 33.42 kg/m  Wt Readings from Last 3 Encounters:  10/24/18 213 lb 6.4 oz (96.8 kg)  06/13/18 217 lb (98.4 kg)  02/15/18 215 lb 6.4 oz (97.7 kg)    Physical Exam Vitals signs and nursing note reviewed.  Constitutional:      Appearance: He is  well-developed.  HENT:     Head: Normocephalic and atraumatic.  Neck:     Musculoskeletal: Normal range of motion.  Cardiovascular:     Rate and Rhythm: Normal rate and regular rhythm.     Heart sounds: Normal heart sounds. No murmur. No friction rub. No gallop.   Pulmonary:     Effort: Pulmonary effort is normal. No tachypnea or respiratory distress.     Breath sounds: Normal breath sounds. No decreased breath sounds, wheezing, rhonchi or rales.  Chest:     Chest wall: No tenderness.  Abdominal:     General: Bowel sounds are normal.     Palpations: Abdomen is soft.  Musculoskeletal: Normal range of motion.     Right foot: Normal range of motion. No deformity.     Left foot: Normal range of motion. No deformity.  Skin:    General: Skin is warm and dry.  Neurological:     Mental Status: He is alert and oriented to person, place, and time.     Coordination: Coordination normal.  Psychiatric:        Behavior: Behavior normal. Behavior is cooperative.        Thought Content: Thought content normal.        Judgment: Judgment normal.          Patient has been counseled extensively about nutrition and exercise as well as the importance of adherence with medications and regular follow-up. The patient was given clear instructions to go to ER or return to medical center if symptoms don't improve, worsen or new problems develop. The patient verbalized understanding.   Follow-up: Return in about 3 months (around 01/23/2019).   Gildardo Pounds, FNP-BC [facility] [place], [place]   10/24/2018, 2:13 PM

## 2018-10-25 ENCOUNTER — Other Ambulatory Visit: Payer: Self-pay | Admitting: Pharmacist

## 2018-10-25 DIAGNOSIS — R109 Unspecified abdominal pain: Secondary | ICD-10-CM

## 2018-10-25 LAB — LIPID PANEL
Chol/HDL Ratio: 2.9 ratio (ref 0.0–5.0)
Cholesterol, Total: 152 mg/dL (ref 100–199)
HDL: 52 mg/dL (ref >39)
LDL Chol Calc (NIH): 78 mg/dL (ref 0–99)
Triglycerides: 123 mg/dL (ref 0–149)
VLDL Cholesterol Cal: 22 mg/dL (ref 5–40)

## 2018-11-03 ENCOUNTER — Other Ambulatory Visit: Payer: Self-pay | Admitting: Pharmacist

## 2018-11-03 DIAGNOSIS — R109 Unspecified abdominal pain: Secondary | ICD-10-CM

## 2018-11-03 MED ORDER — TAMSULOSIN HCL 0.4 MG PO CAPS: 0.4000 mg | ORAL_CAPSULE | Freq: Every day | ORAL | 2 refills | Status: DC

## 2018-11-04 MED FILL — !JANUMET 50-1000MG TABLET: 50-1000 | 30 days supply | Qty: 60 | Fill #0

## 2018-11-04 MED FILL — GLIMEPIRIDE 4 MG TABS: 4 | 90 days supply | Qty: 90 | Fill #0

## 2018-12-09 MED FILL — JANUMET 50-1,000 MG TABLET: 50-1000 | 30 days supply | Qty: 60 | Fill #1

## 2018-12-09 MED FILL — LISINOPRIL 20 MG TABLET: 20 | 90 days supply | Qty: 90 | Fill #0

## 2019-01-11 MED FILL — JANUMET 50-1,000 MG TABLET: 50-1000 | 30 days supply | Qty: 60 | Fill #2

## 2019-01-23 ENCOUNTER — Ambulatory Visit: Payer: Self-pay | Admitting: Nurse Practitioner

## 2019-02-17 ENCOUNTER — Other Ambulatory Visit: Payer: Self-pay

## 2019-02-17 ENCOUNTER — Ambulatory Visit: Payer: Self-pay | Attending: Nurse Practitioner | Admitting: Nurse Practitioner

## 2019-02-17 ENCOUNTER — Encounter: Payer: Self-pay | Admitting: Nurse Practitioner

## 2019-02-17 DIAGNOSIS — G4700 Insomnia, unspecified: Secondary | ICD-10-CM

## 2019-02-17 DIAGNOSIS — Z130 Encounter for screening for diseases of the blood and blood-forming organs and certain disorders involving the immune mechanism: Secondary | ICD-10-CM

## 2019-02-17 DIAGNOSIS — E118 Type 2 diabetes mellitus with unspecified complications: Secondary | ICD-10-CM

## 2019-02-17 DIAGNOSIS — I10 Essential (primary) hypertension: Secondary | ICD-10-CM

## 2019-02-17 DIAGNOSIS — E782 Mixed hyperlipidemia: Secondary | ICD-10-CM

## 2019-02-17 MED ORDER — LISINOPRIL 20 MG PO TABS: 20.0000 mg | ORAL_TABLET | Freq: Every day | ORAL | 1 refills | Status: DC

## 2019-02-17 MED ORDER — GLIMEPIRIDE 4 MG PO TABS: 4.0000 mg | ORAL_TABLET | Freq: Every day | ORAL | 0 refills | Status: DC

## 2019-02-17 MED ORDER — SITAGLIPTIN PHOS-METFORMIN HCL 50-1000 MG PO TABS: 1.0000 | ORAL_TABLET | Freq: Two times a day (BID) | ORAL | 2 refills | Status: DC

## 2019-02-17 MED ORDER — SIMVASTATIN 40 MG PO TABS: 40.0000 mg | ORAL_TABLET | Freq: Every day | ORAL | 0 refills | Status: DC

## 2019-02-17 MED ORDER — GLUCOSE BLOOD VI STRP: ORAL_STRIP | 12 refills | Status: DC

## 2019-02-17 MED FILL — !JANUMET 50-1000MG TABLET: 50-1000 | 15 days supply | Qty: 30 | Fill #0

## 2019-02-17 MED FILL — TRUE METRIX GLUCOSE TEST ST: 50 days supply | Qty: 100 | Fill #0

## 2019-02-17 MED FILL — GLIMEPIRIDE 4 MG TABS: 4 | 30 days supply | Qty: 30 | Fill #0

## 2019-02-17 MED FILL — SIMVASTATIN 40 MG TABLET: 40 | 30 days supply | Qty: 30 | Fill #0

## 2019-02-17 NOTE — Progress Notes (Signed)
Virtual Visit via Telephone Note Due to national recommendations of social distancing due to [place] 19, telehealth visit is felt to be most appropriate for this patient at this time.  I discussed the limitations, risks, security and privacy concerns of performing an evaluation and management service by telephone and the availability of in person appointments. I also discussed with the patient that there may be a patient responsible charge related to this service. The patient expressed understanding and agreed to proceed.    I connected with Lance Howell on 02/17/19  at   8:30 AM EST  EDT by telephone and verified that I am speaking with the correct person using two identifiers.   Consent I discussed the limitations, risks, security and privacy concerns of performing an evaluation and management service by telephone and the availability of in person appointments. I also discussed with the patient that there may be a patient responsible charge related to this service. The patient expressed understanding and agreed to proceed.   Location of Patient: Private Residence   Location of Provider: [place] [facility] [employer] [facility]    Persons participating in Telemedicine visit: Geryl Rankins FNP-BC [place]  [place] TI#[id number]   History of Present Illness: Telemedicine visit for: F/U  has a past medical history of Diabetes mellitus without complication ([place]), Hyperlipidemia, and Hypertension.    DM TYPE 2 Monitoring blood glucose levels only a few times per week fasting with average readings: 140-150s. Currently taking amaryl 4 mg daily and janumet 50-1000 mg BID. He endorses medication adherence. Hyperglycemic symptoms include: "Headaches". Hypoglycemic symptoms include: "shakiness". Lab Results  Component Value Date   HGBA1C 7.3 (A) 10/24/2018    Dyslipidemia LDL almost at goal of <70. He denies any statin intolerance and endorses medication  adherence taking simvastatin 40 mg daily.  Lab Results  Component Value Date   LDLCALC 78 10/24/2018   Essential Hypertension He does not monitor his blood pressure at home. Well controlled with lisinopril 20 mg daily. Denies chest pain, shortness of breath, palpitations, lightheadedness, dizziness, headaches or BLE edema.  BP Readings from Last 3 Encounters:  10/24/18 119/79  06/13/18 (!) 142/87  02/15/18 124/79    Insomnia Endorses difficulty staying asleep. Onset over the past month. Occurring intermittently.     Past Medical History:  Diagnosis Date  . Diabetes mellitus without complication ([place])   . Hyperlipidemia   . Hypertension     Past Surgical History:  Procedure Laterality Date  . APPENDECTOMY    . kidney stone removal      Family History  Problem Relation Age of Onset  . Hypertension Mother   . Diabetes Father     Social History   Socioeconomic History  . Marital status: Married    Spouse name: Not on file  . Number of children: Not on file  . Years of education: Not on file  . Highest education level: Not on file  Occupational History  . Not on file  Tobacco Use  . Smoking status: Never Smoker  . Smokeless tobacco: Never Used  Substance and Sexual Activity  . Alcohol use: Yes    Comment: occasionally   . Drug use: No  . Sexual activity: Yes  Other Topics Concern  . Not on file  Social History Narrative  . Not on file   Social Determinants of Health   Financial Resource Strain:   . Difficulty of Paying Living Expenses: Not on file  Food Insecurity:   .  Worried About Programme researcher, broadcasting/film/video in the Last Year: Not on file  . Ran Out of Food in the Last Year: Not on file  Transportation Needs:   . Lack of Transportation (Medical): Not on file  . Lack of Transportation (Non-Medical): Not on file  Physical Activity:   . Days of Exercise per Week: Not on file  . Minutes of Exercise per Session: Not on file  Stress:   . Feeling of Stress : Not on  file  Social Connections:   . Frequency of Communication with Friends and Family: Not on file  . Frequency of Social Gatherings with Friends and Family: Not on file  . Attends Religious Services: Not on file  . Active Member of Clubs or Organizations: Not on file  . Attends Banker Meetings: Not on file  . Marital Status: Not on file     Observations/Objective: Awake, alert and oriented x 3   Review of Systems  Constitutional: Negative for fever, malaise/fatigue and weight loss.  HENT: Negative.  Negative for nosebleeds.   Eyes: Negative.  Negative for blurred vision, double vision and photophobia.  Respiratory: Negative.  Negative for cough and shortness of breath.   Cardiovascular: Negative.  Negative for chest pain, palpitations and leg swelling.  Gastrointestinal: Negative.  Negative for heartburn, nausea and vomiting.  Musculoskeletal: Negative.  Negative for myalgias.  Neurological: Negative.  Negative for dizziness, focal weakness, seizures and headaches.  Psychiatric/Behavioral: Negative for suicidal ideas. The patient has insomnia.     Assessment and Plan: Najib was seen today for follow-up.  Diagnoses and all orders for this visit:  Controlled type 2 diabetes mellitus with complication, without long-term current use of insulin (HCC) -     glimepiride (AMARYL) 4 MG tablet; Take 1 tablet (4 mg total) by mouth daily with breakfast. PLEASE MAIL. 90 day supply -     glucose blood test strip; Use as instructed. Check blood glucose level by fingerstick twice per day. PLEASE MAIL. 90 day supply -     sitaGLIPtin-metformin (JANUMET) 50-1000 MG tablet; Take 1 tablet by mouth 2 (two) times daily with a meal. PLEASE MAIL. 90 day supply Continue blood sugar control as discussed in office today, low carbohydrate diet, and regular physical exercise as tolerated, 150 minutes per week (30 min each day, 5 days per week, or 50 min 3 days per week). Keep blood sugar logs with  fasting goal of 90-130 mg/dl, post prandial (after you eat) less than 180.  For Hypoglycemia: BS <60 and Hyperglycemia BS >400; contact the clinic ASAP. Annual eye exams and foot exams are recommended.   Essential hypertension -     lisinopril (ZESTRIL) 20 MG tablet; Take 1 tablet (20 mg total) by mouth daily. PLEASE MAIL. 90 day supply Continue all antihypertensives as prescribed.  Remember to bring in your blood pressure log with you for your follow up appointment.  DASH/Mediterranean Diets are healthier choices for HTN.    Mixed hyperlipidemia -     simvastatin (ZOCOR) 40 MG tablet; Take 1 tablet (40 mg total) by mouth daily. PLEASE MAIL. 90 day supply INSTRUCTIONS: Work on a low fat, heart healthy diet and participate in regular aerobic exercise program by working out at least 150 minutes per week; 5 days a week-30 minutes per day. Avoid red meat/beef/steak,  fried foods. junk foods, sodas, sugary drinks, unhealthy snacking, alcohol and smoking.  Drink at least 80 oz of water per day and monitor your carbohydrate intake daily.  Insomnia, unspecified type Recommended melatonin over the counter   Follow Up Instructions Return in about 3 months (around 05/18/2019).     I discussed the assessment and treatment plan with the patient. The patient was provided an opportunity to ask questions and all were answered. The patient agreed with the plan and demonstrated an understanding of the instructions.   The patient was advised to call back or seek an in-person evaluation if the symptoms worsen or if the condition fails to improve as anticipated.  I provided 16 minutes of non-face-to-face time during this encounter including median intraservice time, reviewing previous notes, labs, imaging, medications and explaining diagnosis and management.  Claiborne Rigg, FNP-BC

## 2019-02-24 ENCOUNTER — Ambulatory Visit: Payer: Self-pay | Attending: Nurse Practitioner

## 2019-02-24 ENCOUNTER — Other Ambulatory Visit: Payer: Self-pay

## 2019-02-24 DIAGNOSIS — I10 Essential (primary) hypertension: Secondary | ICD-10-CM

## 2019-02-24 DIAGNOSIS — E782 Mixed hyperlipidemia: Secondary | ICD-10-CM

## 2019-02-24 DIAGNOSIS — E118 Type 2 diabetes mellitus with unspecified complications: Secondary | ICD-10-CM

## 2019-02-24 DIAGNOSIS — Z130 Encounter for screening for diseases of the blood and blood-forming organs and certain disorders involving the immune mechanism: Secondary | ICD-10-CM

## 2019-02-25 ENCOUNTER — Other Ambulatory Visit: Payer: Self-pay | Admitting: Nurse Practitioner

## 2019-02-25 LAB — CMP14+EGFR
ALT: 51 IU/L — ABNORMAL HIGH (ref 0–44)
AST: 22 IU/L (ref 0–40)
Albumin/Globulin Ratio: 1.9 (ref 1.2–2.2)
Albumin: 4.6 g/dL (ref 4.0–5.0)
Alkaline Phosphatase: 87 IU/L (ref 39–117)
BUN/Creatinine Ratio: 15 (ref 9–20)
BUN: 14 mg/dL (ref 6–24)
Bilirubin Total: 0.3 mg/dL (ref 0.0–1.2)
CO2: 22 mmol/L (ref 20–29)
Calcium: 9.7 mg/dL (ref 8.7–10.2)
Chloride: 103 mmol/L (ref 96–106)
Creatinine, Ser: 0.91 mg/dL (ref 0.76–1.27)
GFR calc Af Amer: 116 mL/min/{1.73_m2} (ref >59)
GFR calc non Af Amer: 100 mL/min/{1.73_m2} (ref >59)
Globulin, Total: 2.4 g/dL (ref 1.5–4.5)
Glucose: 219 mg/dL — ABNORMAL HIGH (ref 65–99)
Potassium: 4.6 mmol/L (ref 3.5–5.2)
Sodium: 140 mmol/L (ref 134–144)
Total Protein: 7 g/dL (ref 6.0–8.5)

## 2019-02-25 LAB — CBC
Hematocrit: 48.1 % (ref 37.5–51.0)
Hemoglobin: 16.1 g/dL (ref 13.0–17.7)
MCH: 29.8 pg (ref 26.6–33.0)
MCHC: 33.5 g/dL (ref 31.5–35.7)
MCV: 89 fL (ref 79–97)
Platelets: 250 10*3/uL (ref 150–450)
RBC: 5.41 x10E6/uL (ref 4.14–5.80)
RDW: 12.5 % (ref 11.6–15.4)
WBC: 8.6 10*3/uL (ref 3.4–10.8)

## 2019-02-25 LAB — LIPID PANEL
Chol/HDL Ratio: 3.5 ratio (ref 0.0–5.0)
Cholesterol, Total: 125 mg/dL (ref 100–199)
HDL: 36 mg/dL — ABNORMAL LOW (ref >39)
LDL Chol Calc (NIH): 65 mg/dL (ref 0–99)
Triglycerides: 136 mg/dL (ref 0–149)
VLDL Cholesterol Cal: 24 mg/dL (ref 5–40)

## 2019-02-25 LAB — HEMOGLOBIN A1C
Est. average glucose Bld gHb Est-mCnc: 203 mg/dL
Hgb A1c MFr Bld: 8.7 % — ABNORMAL HIGH (ref 4.8–5.6)

## 2019-03-08 MED FILL — LISINOPRIL 20 MG TABLET: 20 | 30 days supply | Qty: 30 | Fill #0

## 2019-03-08 MED FILL — !JANUMET 50-1000MG TABLET: 50-1000 | 15 days supply | Qty: 30 | Fill #1

## 2019-03-22 MED FILL — GLIMEPIRIDE 4 MG TABS: 4 | 60 days supply | Qty: 60 | Fill #1

## 2019-03-22 MED FILL — !JANUMET 50-1000MG TABLET: 50-1000 | 30 days supply | Qty: 60 | Fill #2

## 2019-04-13 MED FILL — LISINOPRIL 20 MG TABLET: 20 | 30 days supply | Qty: 30 | Fill #1

## 2019-04-25 MED FILL — JANUMET 50-1,000 MG TABLET: 50-1000 | 30 days supply | Qty: 60 | Fill #3

## 2019-04-25 MED FILL — SIMVASTATIN 40 MG TABLET: 40 | 30 days supply | Qty: 30 | Fill #1

## 2019-05-18 ENCOUNTER — Ambulatory Visit: Payer: Self-pay | Admitting: Nurse Practitioner

## 2019-05-19 ENCOUNTER — Ambulatory Visit: Payer: Self-pay | Admitting: Nurse Practitioner

## 2019-05-19 ENCOUNTER — Other Ambulatory Visit: Payer: Self-pay

## 2019-05-19 ENCOUNTER — Encounter: Payer: Self-pay | Admitting: Nurse Practitioner

## 2019-05-19 ENCOUNTER — Ambulatory Visit: Payer: Self-pay | Attending: Nurse Practitioner | Admitting: Nurse Practitioner

## 2019-05-19 VITALS — BP 102/67 | HR 84 | Temp 97.7°F | Ht 67.0 in | Wt 210.0 lb

## 2019-05-19 DIAGNOSIS — E782 Mixed hyperlipidemia: Secondary | ICD-10-CM

## 2019-05-19 DIAGNOSIS — N209 Urinary calculus, unspecified: Secondary | ICD-10-CM

## 2019-05-19 DIAGNOSIS — I10 Essential (primary) hypertension: Secondary | ICD-10-CM

## 2019-05-19 DIAGNOSIS — E118 Type 2 diabetes mellitus with unspecified complications: Secondary | ICD-10-CM

## 2019-05-19 DIAGNOSIS — Z114 Encounter for screening for human immunodeficiency virus [HIV]: Secondary | ICD-10-CM

## 2019-05-19 LAB — GLUCOSE, POCT (MANUAL RESULT ENTRY): POC Glucose: 157 mg/dl — AB (ref 70–99)

## 2019-05-19 LAB — POCT GLYCOSYLATED HEMOGLOBIN (HGB A1C): Hemoglobin A1C: 8 % — AB (ref 4.0–5.6)

## 2019-05-19 MED ORDER — TAMSULOSIN HCL 0.4 MG PO CAPS: 0.4000 mg | ORAL_CAPSULE | Freq: Every day | ORAL | 2 refills | Status: DC

## 2019-05-19 MED ORDER — SITAGLIPTIN PHOS-METFORMIN HCL 50-1000 MG PO TABS: 1.0000 | ORAL_TABLET | Freq: Two times a day (BID) | ORAL | 2 refills | Status: DC

## 2019-05-19 MED ORDER — GLIMEPIRIDE 4 MG PO TABS: 8.0000 mg | ORAL_TABLET | Freq: Every day | ORAL | 0 refills | Status: DC

## 2019-05-19 MED ORDER — SIMVASTATIN 40 MG PO TABS: 40.0000 mg | ORAL_TABLET | Freq: Every day | ORAL | 2 refills | Status: DC

## 2019-05-19 MED ORDER — LISINOPRIL 20 MG PO TABS: 20.0000 mg | ORAL_TABLET | Freq: Every day | ORAL | 1 refills | Status: DC

## 2019-05-19 MED ORDER — GLUCOSE BLOOD VI STRP: ORAL_STRIP | 12 refills | Status: DC

## 2019-05-19 MED FILL — LISINOPRIL 20 MG TABLET: 20 | 30 days supply | Qty: 30 | Fill #2

## 2019-05-19 MED FILL — GLIMEPIRIDE 4 MG TABS: 4 | 90 days supply | Qty: 180 | Fill #0

## 2019-05-19 MED FILL — TAMSULOSIN HCL 0.4 MG CAP: 0.4 | 30 days supply | Qty: 30 | Fill #0

## 2019-05-19 MED FILL — TRUE METRIX TEST STRIP: 100 days supply | Qty: 200 | Fill #0

## 2019-05-19 MED FILL — ?SIMVASTATIN 40MG TABLETS: 40 | 30 days supply | Qty: 30 | Fill #0

## 2019-05-19 MED FILL — JANUMET 50-1,000 MG TABLET: 50-1000 | 30 days supply | Qty: 60 | Fill #0

## 2019-05-19 NOTE — Progress Notes (Signed)
Assessment & Plan:  Lance Howell was seen today for follow-up.  Diagnoses and all orders for this visit:  Controlled type 2 diabetes mellitus with complication, without long-term current use of insulin (HCC) -     Glucose (CBG) -     HgB A1c -     glimepiride (AMARYL) 4 MG tablet; Take 2 tablets (8 mg total) by mouth daily with breakfast. PLEASE MAIL. 90 day supply -     sitaGLIPtin-metformin (JANUMET) 50-1000 MG tablet; Take 1 tablet by mouth 2 (two) times daily with a meal. PLEASE MAIL. 90 day supply -     glucose blood test strip; Use as instructed. Check blood glucose level by fingerstick twice per day. PLEASE MAIL. 90 day supply Continue blood sugar control as discussed in office today, low carbohydrate diet, and regular physical exercise as tolerated, 150 minutes per week (30 min each day, 5 days per week, or 50 min 3 days per week). Keep blood sugar logs with fasting goal of 90-130 mg/dl, post prandial (after you eat) less than 180.  For Hypoglycemia: BS <60 and Hyperglycemia BS >400; contact the clinic ASAP. Annual eye exams and foot exams are recommended.  Mixed hyperlipidemia -     simvastatin (ZOCOR) 40 MG tablet; Take 1 tablet (40 mg total) by mouth daily. PLEASE MAIL. 90 day supply INSTRUCTIONS: Work on a low fat, heart healthy diet and participate in regular aerobic exercise program by working out at least 150 minutes per week; 5 days a week-30 minutes per day. Avoid red meat/beef/steak,  fried foods. junk foods, sodas, sugary drinks, unhealthy snacking, alcohol and smoking.  Drink at least 80 oz of water per day and monitor your carbohydrate intake daily.   Essential hypertension -     lisinopril (ZESTRIL) 20 MG tablet; Take 1 tablet (20 mg total) by mouth daily. PLEASE MAIL. 90 day supply. -     Basic metabolic panel Continue all antihypertensives as prescribed.  Remember to bring in your blood pressure log with you for your follow up appointment.  DASH/Mediterranean Diets are  healthier choices for HTN.   Encounter for screening for HIV -     HIV antibody (with reflex)  Calcium urolithiasis -     tamsulosin (FLOMAX) 0.4 MG CAPS capsule; Take 1 capsule (0.4 mg total) by mouth daily. PLEASE MAIL    Patient has been counseled on age-appropriate routine health concerns for screening and prevention. These are reviewed and up-to-date. Referrals have been placed accordingly. Immunizations are up-to-date or declined.    Subjective:   Chief Complaint  Patient presents with  . Follow-up    Pt. is here diabetes follow up and HTN.    HPI Lance Howell 49 y.o. male presents to office today for follow up.   DM TYPE 2 Improved. He has his meter for review today.  7  Day average 162 14 Day average 166 30 Day average 171 Currently taking Janumet 50-1000 mg BID and glimepiride 4 mg daily which I will increase to 8 mg today. Taking ACE and STATIN.  Lab Results  Component Value Date   HGBA1C 8.0 (A) 05/19/2019   Lab Results  Component Value Date   HGBA1C 8.7 (H) 02/24/2019   Dyslipidemia LDL at goal of <70. Taking simvistatin 40 mg daily as prescribed. Denies statin intolerance.  Lab Results  Component Value Date   [place] 65 02/24/2019    Essential Hypertension Well controlled. Weight is down 7lbs. Taking lisinopril 20 mg daily as prescribed. Denies  chest pain, shortness of breath, palpitations, lightheadedness, dizziness, headaches or BLE edema.  BP Readings from Last 3 Encounters:  05/19/19 102/67  10/24/18 119/79  06/13/18 (!) 142/87   Review of Systems  Constitutional: Negative for fever, malaise/fatigue and weight loss.  HENT: Negative.  Negative for nosebleeds.   Eyes: Negative.  Negative for blurred vision, double vision and photophobia.  Respiratory: Negative.  Negative for cough and shortness of breath.   Cardiovascular: Negative.  Negative for chest pain, palpitations and leg swelling.  Gastrointestinal: Negative.  Negative for heartburn,  nausea and vomiting.  Musculoskeletal: Negative.  Negative for myalgias.  Neurological: Negative.  Negative for dizziness, focal weakness, seizures and headaches.  Psychiatric/Behavioral: Negative.  Negative for suicidal ideas.    Past Medical History:  Diagnosis Date  . Diabetes mellitus without complication ([place])   . Hyperlipidemia   . Hypertension     Past Surgical History:  Procedure Laterality Date  . APPENDECTOMY    . kidney stone removal      Family History  Problem Relation Age of Onset  . Hypertension Mother   . Diabetes Father     Social History Reviewed with no changes to be made today.   Outpatient Medications Prior to Visit  Medication Sig Dispense Refill  . Blood Glucose Monitoring Suppl (TRUE METRIX METER) w/Device KIT Use as instructed. 1 kit 0  . ibuprofen (ADVIL) 600 MG tablet Take 1 tablet (600 mg total) by mouth every 8 (eight) hours as needed. 60 tablet 0  . TRUEplus Lancets 28G MISC Use as instructed. Check blood glucose level by fingerstick twice per day.PLEASE MAIL. 90 day supply 200 each 12  . glucose blood test strip Use as instructed. Check blood glucose level by fingerstick twice per day. PLEASE MAIL. 90 day supply 200 each 12  . lisinopril (ZESTRIL) 20 MG tablet Take 1 tablet (20 mg total) by mouth daily. PLEASE MAIL. 90 day supply 90 tablet 1  . simvastatin (ZOCOR) 40 MG tablet Take 1 tablet (40 mg total) by mouth daily. PLEASE MAIL. 90 day supply 90 tablet 0  . sitaGLIPtin-metformin (JANUMET) 50-1000 MG tablet Take 1 tablet by mouth 2 (two) times daily with a meal. PLEASE MAIL. 90 day supply 180 tablet 2  . tamsulosin (FLOMAX) 0.4 MG CAPS capsule Take 1 capsule (0.4 mg total) by mouth daily. 30 capsule 2  . cetirizine (ZYRTEC) 10 MG tablet Take 1 tablet (10 mg total) by mouth daily. PLEASE MAIL. 90 day supply 90 tablet 3  . glimepiride (AMARYL) 4 MG tablet Take 1 tablet (4 mg total) by mouth daily with breakfast. PLEASE MAIL. 90 day supply 90  tablet 0   No facility-administered medications prior to visit.    No Known Allergies     Objective:    BP 102/67 (BP Location: Left Arm, Patient Position: Sitting, Cuff Size: Normal)   Pulse 84   Temp 97.7 F (36.5 C) (Temporal)   Ht 5' 7"  (1.702 m)   Wt 210 lb (95.3 kg)   SpO2 95%   BMI 32.89 kg/m  Wt Readings from Last 3 Encounters:  05/19/19 210 lb (95.3 kg)  10/24/18 213 lb 6.4 oz (96.8 kg)  06/13/18 217 lb (98.4 kg)    Physical Exam Vitals and nursing note reviewed.  Constitutional:      Appearance: He is well-developed.  HENT:     Head: Normocephalic and atraumatic.  Cardiovascular:     Rate and Rhythm: Normal rate and regular rhythm.  Heart sounds: Normal heart sounds. No murmur. No friction rub. No gallop.   Pulmonary:     Effort: Pulmonary effort is normal. No tachypnea or respiratory distress.     Breath sounds: Normal breath sounds. No decreased breath sounds, wheezing, rhonchi or rales.  Chest:     Chest wall: No tenderness.  Abdominal:     General: Bowel sounds are normal.     Palpations: Abdomen is soft.  Musculoskeletal:        General: Normal range of motion.     Cervical back: Normal range of motion.  Skin:    General: Skin is warm and dry.  Neurological:     Mental Status: He is alert and oriented to person, place, and time.     Coordination: Coordination normal.  Psychiatric:        Behavior: Behavior normal. Behavior is cooperative.        Thought Content: Thought content normal.        Judgment: Judgment normal.          Patient has been counseled extensively about nutrition and exercise as well as the importance of adherence with medications and regular follow-up. The patient was given clear instructions to go to ER or return to medical center if symptoms don't improve, worsen or new problems develop. The patient verbalized understanding.   Follow-up: Return in about 11 weeks (around 08/04/2019) for needs to see me at the end of  june. Gildardo Pounds, FNP-BC [facility] and [facility] [place], [place]   05/19/2019, 5:42 PM

## 2019-05-20 LAB — BASIC METABOLIC PANEL
BUN/Creatinine Ratio: 20 (ref 9–20)
BUN: 16 mg/dL (ref 6–24)
CO2: 22 mmol/L (ref 20–29)
Calcium: 10.2 mg/dL (ref 8.7–10.2)
Chloride: 106 mmol/L (ref 96–106)
Creatinine, Ser: 0.81 mg/dL (ref 0.76–1.27)
GFR calc Af Amer: 122 mL/min/{1.73_m2} (ref >59)
GFR calc non Af Amer: 106 mL/min/{1.73_m2} (ref >59)
Glucose: 146 mg/dL — ABNORMAL HIGH (ref 65–99)
Potassium: 4.4 mmol/L (ref 3.5–5.2)
Sodium: 143 mmol/L (ref 134–144)

## 2019-05-20 LAB — HIV ANTIBODY (ROUTINE TESTING W REFLEX): HIV Screen 4th Generation wRfx: NONREACTIVE

## 2019-06-14 MED FILL — LISINOPRIL 20 MG TABLET: 20 | 30 days supply | Qty: 30 | Fill #3

## 2019-07-05 ENCOUNTER — Other Ambulatory Visit: Payer: Self-pay | Admitting: Nurse Practitioner

## 2019-07-05 DIAGNOSIS — E118 Type 2 diabetes mellitus with unspecified complications: Secondary | ICD-10-CM

## 2019-07-05 MED FILL — JANUMET 50-1,000 MG TABLET: 50-1000 | 30 days supply | Qty: 60 | Fill #4

## 2019-07-05 MED FILL — ?SIMVASTATIN 40MG TABLETS: 40 | 30 days supply | Qty: 30 | Fill #1

## 2019-07-05 MED FILL — LISINOPRIL 20 MG TABLET: 20 | 30 days supply | Qty: 30 | Fill #4

## 2019-08-03 ENCOUNTER — Other Ambulatory Visit: Payer: Self-pay | Admitting: Family Medicine

## 2019-08-03 ENCOUNTER — Other Ambulatory Visit: Payer: Self-pay | Admitting: Pharmacist

## 2019-08-03 DIAGNOSIS — I10 Essential (primary) hypertension: Secondary | ICD-10-CM

## 2019-08-03 MED ORDER — LISINOPRIL 20 MG PO TABS: 20.0000 mg | ORAL_TABLET | Freq: Every day | ORAL | 1 refills | Status: DC

## 2019-08-03 MED FILL — LISINOPRIL 20 MG TABLET: 20 | 90 days supply | Qty: 90 | Fill #0

## 2019-08-03 MED FILL — SIMVASTATIN 40 MG TABLET: 40 | 90 days supply | Qty: 90 | Fill #2

## 2019-08-03 MED FILL — GLIMEPIRIDE 4 MG TABS: 4 | 90 days supply | Qty: 180 | Fill #0

## 2019-08-03 MED FILL — JANUMET 50-1,000 MG TABLET: 50-1000 | 30 days supply | Qty: 60 | Fill #5

## 2019-08-04 ENCOUNTER — Ambulatory Visit: Payer: Self-pay | Admitting: Nurse Practitioner

## 2019-09-06 MED FILL — JANUMET 50-1,000 MG TABLET: 50-1000 | 30 days supply | Qty: 60 | Fill #6

## 2019-09-11 ENCOUNTER — Other Ambulatory Visit: Payer: Self-pay | Admitting: Nurse Practitioner

## 2019-09-11 ENCOUNTER — Other Ambulatory Visit: Payer: Self-pay

## 2019-09-11 ENCOUNTER — Encounter: Payer: Self-pay | Admitting: Nurse Practitioner

## 2019-09-11 ENCOUNTER — Ambulatory Visit: Payer: Self-pay | Attending: Nurse Practitioner | Admitting: Nurse Practitioner

## 2019-09-11 DIAGNOSIS — E782 Mixed hyperlipidemia: Secondary | ICD-10-CM

## 2019-09-11 DIAGNOSIS — I10 Essential (primary) hypertension: Secondary | ICD-10-CM

## 2019-09-11 DIAGNOSIS — M79672 Pain in left foot: Secondary | ICD-10-CM

## 2019-09-11 DIAGNOSIS — E118 Type 2 diabetes mellitus with unspecified complications: Secondary | ICD-10-CM

## 2019-09-11 DIAGNOSIS — G8929 Other chronic pain: Secondary | ICD-10-CM

## 2019-09-11 MED ORDER — SITAGLIPTIN PHOS-METFORMIN HCL 50-1000 MG PO TABS: 1.0000 | ORAL_TABLET | Freq: Two times a day (BID) | ORAL | 2 refills | Status: DC

## 2019-09-11 MED ORDER — METHOCARBAMOL 500 MG PO TABS: 500.0000 mg | ORAL_TABLET | Freq: Three times a day (TID) | ORAL | 0 refills | Status: AC | PRN

## 2019-09-11 MED ORDER — MELOXICAM 7.5 MG PO TABS: 7.5000 mg | ORAL_TABLET | Freq: Every day | ORAL | 1 refills | Status: DC

## 2019-09-11 MED ORDER — GLIMEPIRIDE 4 MG PO TABS: 8.0000 mg | ORAL_TABLET | Freq: Every day | ORAL | 1 refills | Status: DC

## 2019-09-11 MED ORDER — LISINOPRIL 20 MG PO TABS: 20.0000 mg | ORAL_TABLET | Freq: Every day | ORAL | 1 refills | Status: DC

## 2019-09-11 MED ORDER — SIMVASTATIN 40 MG PO TABS: 40.0000 mg | ORAL_TABLET | Freq: Every day | ORAL | 2 refills | Status: DC

## 2019-09-11 MED ORDER — TRUEPLUS LANCETS 28G MISC: 12 refills | Status: DC

## 2019-09-11 MED ORDER — GLUCOSE BLOOD VI STRP: ORAL_STRIP | 12 refills | Status: DC

## 2019-09-11 MED FILL — MELOXICAM 7.5 MG TABLET: 7.5 | 30 days supply | Qty: 30 | Fill #0

## 2019-09-11 MED FILL — TRUEplus LANCETS 28G MISC: 100 days supply | Qty: 200 | Fill #0

## 2019-09-11 MED FILL — JANUMET 50-1,000 MG TABLET: 50-1000 | 30 days supply | Qty: 60 | Fill #0

## 2019-09-11 MED FILL — TRUE METRIX TEST STRIP: 100 days supply | Qty: 200 | Fill #0

## 2019-09-11 MED FILL — METHOCARBAMOL 500 MG TABS: 500 | 40 days supply | Qty: 120 | Fill #0

## 2019-09-11 NOTE — Progress Notes (Signed)
Virtual Visit via Telephone Note Due to national recommendations of social distancing due to [place] 19, telehealth visit is felt to be most appropriate for this patient at this time.  I discussed the limitations, risks, security and privacy concerns of performing an evaluation and management service by telephone and the availability of in person appointments. I also discussed with the patient that there may be a patient responsible charge related to this service. The patient expressed understanding and agreed to proceed.    I connected with Lance Howell on 09/11/19  at   9:30 AM EDT  EDT by telephone and verified that I am speaking with the correct person using two identifiers.   Consent I discussed the limitations, risks, security and privacy concerns of performing an evaluation and management service by telephone and the availability of in person appointments. I also discussed with the patient that there may be a patient responsible charge related to this service. The patient expressed understanding and agreed to proceed.   Location of Patient: Private residence   Location of Provider: [place] and [place] participating in Telemedicine visit: Lance Rankins FNP-BC [place]    History of Present Illness: Telemedicine visit for: F/U  has a past medical history of Diabetes mellitus without complication ([place]), Hyperlipidemia, and Hypertension.  DM TYPE 2 Poorly controlled. He is not dietary or exercise adherent. Currently prescribed amaryl 8 mg daily and janumet 50-1000 mg BID. I am adding lantus 10 units nightly. He will need to pick this up from the pharmacy. He is overdue for eye exam. Taking zocor and ACE. LDL not at goal Lab Results  Component Value Date   HGBA1C 8.5 (H) 09/11/2019   Lab Results  Component Value Date   LDLCALC 84 09/11/2019   Essential Hypertension Well controlled with lisinopril 20 mg daily. Denies  chest pain, shortness of breath, palpitations, lightheadedness, dizziness, headaches or BLE edema.  BP Readings from Last 3 Encounters:  05/19/19 102/67  10/24/18 119/79  06/13/18 (!) 142/87    Past Medical History:  Diagnosis Date  . Diabetes mellitus without complication ([place])   . Hyperlipidemia   . Hypertension     Past Surgical History:  Procedure Laterality Date  . APPENDECTOMY    . kidney stone removal      Family History  Problem Relation Age of Onset  . Hypertension Mother   . Diabetes Father     Social History   Socioeconomic History  . Marital status: Married    Spouse name: Not on file  . Number of children: Not on file  . Years of education: Not on file  . Highest education level: Not on file  Occupational History  . Not on file  Tobacco Use  . Smoking status: Never Smoker  . Smokeless tobacco: Never Used  Vaping Use  . Vaping Use: Never used  Substance and Sexual Activity  . Alcohol use: Yes    Comment: occasionally   . Drug use: No  . Sexual activity: Yes  Other Topics Concern  . Not on file  Social History Narrative  . Not on file   Social Determinants of Health   Financial Resource Strain:   . Difficulty of Paying Living Expenses:   Food Insecurity:   . Worried About Charity fundraiser in the Last Year:   . Arboriculturist in the Last Year:   Transportation Needs:   . Film/video editor (Medical):   Marland Kitchen  Lack of Transportation (Non-Medical):   Physical Activity:   . Days of Exercise per Week:   . Minutes of Exercise per Session:   Stress:   . Feeling of Stress :   Social Connections:   . Frequency of Communication with Friends and Family:   . Frequency of Social Gatherings with Friends and Family:   . Attends Religious Services:   . Active Member of Clubs or Organizations:   . Attends Archivist Meetings:   Marland Kitchen Marital Status:      Observations/Objective: Awake, alert and oriented x 3   Review of Systems   Constitutional: Negative for fever, malaise/fatigue and weight loss.  HENT: Negative.  Negative for nosebleeds.   Eyes: Negative.  Negative for blurred vision, double vision and photophobia.  Respiratory: Negative.  Negative for cough and shortness of breath.   Cardiovascular: Negative.  Negative for chest pain, palpitations and leg swelling.  Gastrointestinal: Negative.  Negative for heartburn, nausea and vomiting.  Musculoskeletal: Positive for back pain and joint pain. Negative for myalgias.  Neurological: Negative.  Negative for dizziness, focal weakness, seizures and headaches.  Psychiatric/Behavioral: Negative.  Negative for suicidal ideas.    Assessment and Plan: Lance Howell was seen today for follow-up.  Diagnoses and all orders for this visit:  Controlled type 2 diabetes mellitus with complication, without long-term current use of insulin (HCC) -     Hemoglobin A1c -     Lipid Panel -     CMP14+EGFR -     Hepatitis C Antibody -     Microalbumin/Creatinine Ratio, Urine -     sitaGLIPtin-metformin (JANUMET) 50-1000 MG tablet; Take 1 tablet by mouth 2 (two) times daily with a meal. PLEASE MAIL. 90 day supply -     glimepiride (AMARYL) 4 MG tablet; Take 2 tablets (8 mg total) by mouth daily with breakfast. Please fill as a 90 day supply -     glucose blood test strip; Use as instructed. Check blood glucose level by fingerstick twice per day. PLEASE MAIL. 90 day supply -     TRUEplus Lancets 28G MISC; Use as instructed. Check blood glucose level by fingerstick twice per day.PLEASE MAIL. 90 day supply -     insulin glargine (LANTUS) 100 UNIT/ML Solostar Pen; Inject 20 Units into the skin daily at 10 pm. -     Insulin Pen Needle (B-D UF III MINI PEN NEEDLES) 31G X 5 MM MISC; Use as instructed. Inject into the skin once nightly. -     Ambulatory referral to Ophthalmology Continue blood sugar control as discussed in office today, low carbohydrate diet, and regular physical exercise as  tolerated, 150 minutes per week (30 min each day, 5 days per week, or 50 min 3 days per week). Keep blood sugar logs with fasting goal of 90-130 mg/dl, post prandial (after you eat) less than 180.  For Hypoglycemia: BS <60 and Hyperglycemia BS >400; contact the clinic ASAP. Annual eye exams and foot exams are recommended.   Essential hypertension -     lisinopril (ZESTRIL) 20 MG tablet; Take 1 tablet (20 mg total) by mouth daily. PLEASE MAIL. 90 day supply. Continue all antihypertensives as prescribed.  Remember to bring in your blood pressure log with you for your follow up appointment.  DASH/Mediterranean Diets are healthier choices for HTN.    Mixed hyperlipidemia -     simvastatin (ZOCOR) 40 MG tablet; Take 1 tablet (40 mg total) by mouth daily. PLEASE MAIL. 90 day supply INSTRUCTIONS:  Work on a low fat, heart healthy diet and participate in regular aerobic exercise program by working out at least 150 minutes per week; 5 days a week-30 minutes per day. Avoid red meat/beef/steak,  fried foods. junk foods, sodas, sugary drinks, unhealthy snacking, alcohol and smoking.  Drink at least 80 oz of water per day and monitor your carbohydrate intake daily.    Chronic heel pain, left -     meloxicam (MOBIC) 7.5 MG tablet; Take 1 tablet (7.5 mg total) by mouth daily. -     methocarbamol (ROBAXIN) 500 MG tablet; Take 1 tablet (500 mg total) by mouth every 8 (eight) hours as needed for muscle spasms.     Follow Up Instructions Return in about 3 months (around 12/12/2019).     I discussed the assessment and treatment plan with the patient. The patient was provided an opportunity to ask questions and all were answered. The patient agreed with the plan and demonstrated an understanding of the instructions.   The patient was advised to call back or seek an in-person evaluation if the symptoms worsen or if the condition fails to improve as anticipated.  I provided 15 minutes of non-face-to-face time  during this encounter including median intraservice time, reviewing previous notes, labs, imaging, medications and explaining diagnosis and management.  Gildardo Pounds, FNP-BC  Coming down from [place] 1 month ago. Went to hospital for knee pain and states they did not see anything. He is still experiencing pain in the right knee. Wearing elastic support on his knee. There is no swelling.  The right knee. He takes ibuprofen for the pain. Not very effective. Also makes his stomach hurt. They did xray .

## 2019-09-12 LAB — LIPID PANEL
Chol/HDL Ratio: 3.4 ratio (ref 0.0–5.0)
Cholesterol, Total: 154 mg/dL (ref 100–199)
HDL: 45 mg/dL (ref >39)
LDL Chol Calc (NIH): 84 mg/dL (ref 0–99)
Triglycerides: 144 mg/dL (ref 0–149)
VLDL Cholesterol Cal: 25 mg/dL (ref 5–40)

## 2019-09-12 LAB — CMP14+EGFR
ALT: 29 IU/L (ref 0–44)
AST: 19 IU/L (ref 0–40)
Albumin/Globulin Ratio: 1.9 (ref 1.2–2.2)
Albumin: 4.6 g/dL (ref 4.0–5.0)
Alkaline Phosphatase: 72 IU/L (ref 48–121)
BUN/Creatinine Ratio: 14 (ref 9–20)
BUN: 13 mg/dL (ref 6–24)
Bilirubin Total: 0.3 mg/dL (ref 0.0–1.2)
CO2: 24 mmol/L (ref 20–29)
Calcium: 9.6 mg/dL (ref 8.7–10.2)
Chloride: 104 mmol/L (ref 96–106)
Creatinine, Ser: 0.93 mg/dL (ref 0.76–1.27)
GFR calc Af Amer: 112 mL/min/{1.73_m2} (ref >59)
GFR calc non Af Amer: 97 mL/min/{1.73_m2} (ref >59)
Globulin, Total: 2.4 g/dL (ref 1.5–4.5)
Glucose: 189 mg/dL — ABNORMAL HIGH (ref 65–99)
Potassium: 4.5 mmol/L (ref 3.5–5.2)
Sodium: 141 mmol/L (ref 134–144)
Total Protein: 7 g/dL (ref 6.0–8.5)

## 2019-09-12 LAB — MICROALBUMIN / CREATININE URINE RATIO
Creatinine, Urine: 133 mg/dL
Microalb/Creat Ratio: 22 mg/g creat (ref 0–29)
Microalbumin, Urine: 29.3 ug/mL

## 2019-09-12 LAB — HEMOGLOBIN A1C
Est. average glucose Bld gHb Est-mCnc: 197 mg/dL
Hgb A1c MFr Bld: 8.5 % — ABNORMAL HIGH (ref 4.8–5.6)

## 2019-09-12 LAB — HEPATITIS C ANTIBODY: Hep C Virus Ab: <0.1 s/co ratio (ref 0.0–0.9)

## 2019-09-14 ENCOUNTER — Encounter: Payer: Self-pay | Admitting: Nurse Practitioner

## 2019-09-14 MED ORDER — INSULIN GLARGINE 100 UNIT/ML SOLOSTAR PEN: 20.0000 [IU] | PEN_INJECTOR | Freq: Every day | SUBCUTANEOUS | 11 refills | Status: DC

## 2019-09-14 MED ORDER — BD PEN NEEDLE MINI U/F 31G X 5 MM MISC: 1 refills | Status: DC

## 2019-09-15 MED FILL — !LANTUS SOLOSTAR 100UNITS/M: 100 | 30 days supply | Qty: 6 | Fill #0

## 2019-09-15 MED FILL — TRUEPLUS 5-BEVEL PEN NEEDLE: 31G X 5 MM | 90 days supply | Qty: 100 | Fill #0

## 2019-09-24 IMAGING — CT CT ABD-PELV W/ CM
2 of 5 series · 16 of 46 positions shown, 18 images · IV contrast (Omni 300)
Comparison: None.

CLINICAL DATA: Epigastric pain for 1 month.

EXAM:
CT ABDOMEN AND PELVIS WITH CONTRAST
TECHNIQUE: Multidetector CT imaging of the abdomen and pelvis was performed
using the standard protocol following bolus administration of
intravenous contrast.
CONTRAST:  100mL 12WBCQ-I11 IOPAMIDOL (12WBCQ-I11) INJECTION 61%

[Series 3: a/p w/ 5mm · axial · 0.86mm/px · z∈[+742,+1222]mm · 13 of 110 slices shown, 15 images]
[im 7/110  soft-tissue]
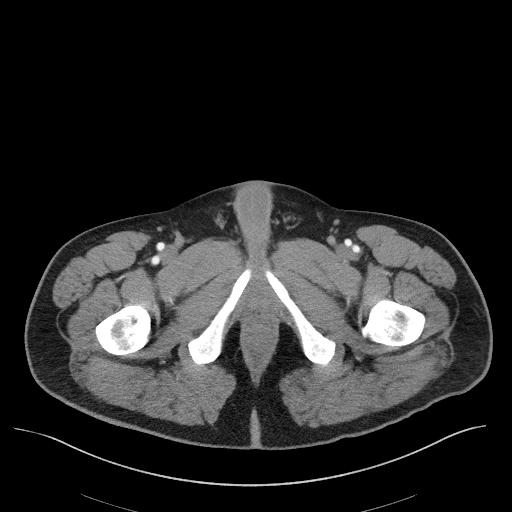
[im 7/110  bone]
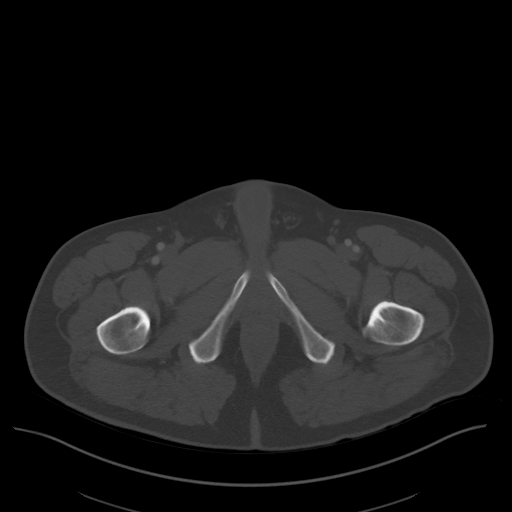
[im 13/110  soft-tissue]
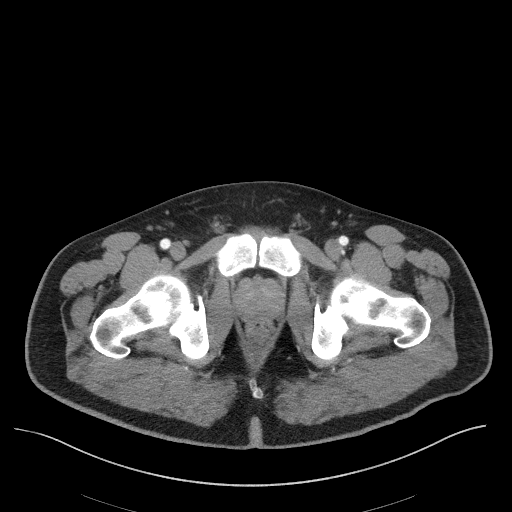
[im 26/110  soft-tissue]
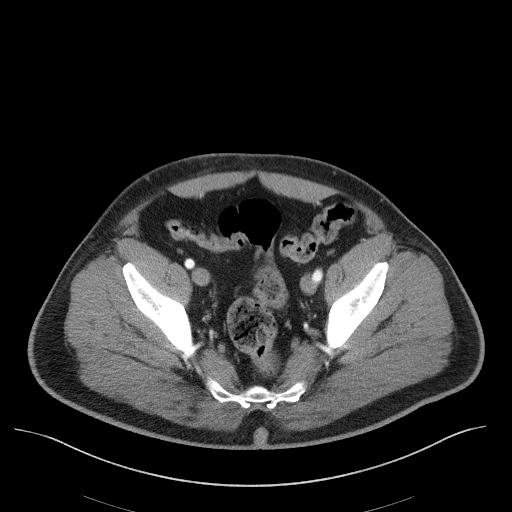
[im 33/110  soft-tissue]
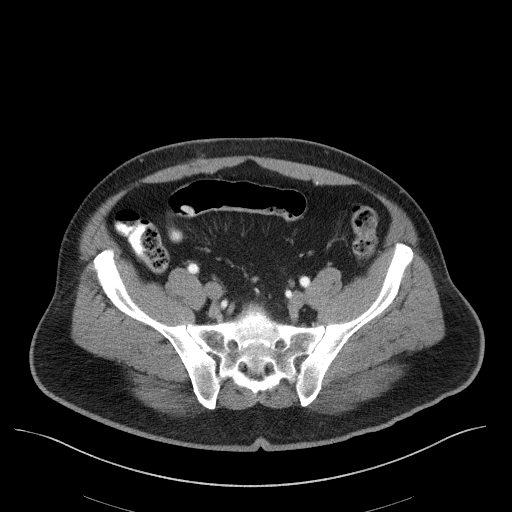
[im 39/110  soft-tissue]
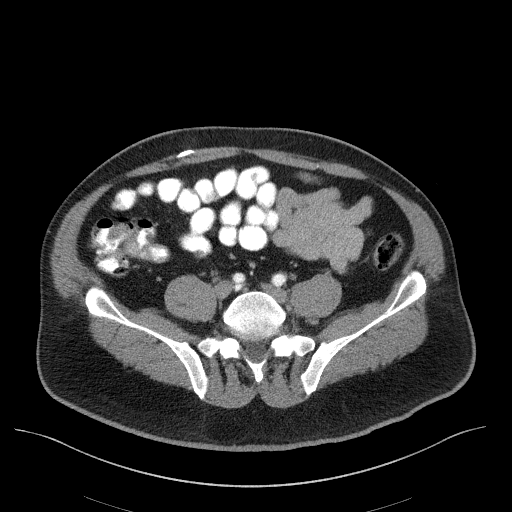
[im 45/110  soft-tissue]
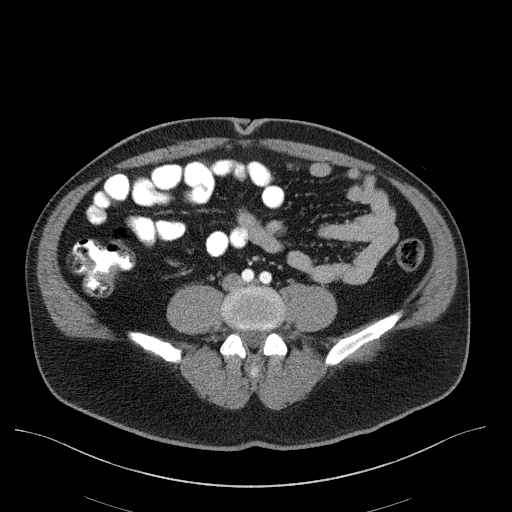
[im 58/110  soft-tissue]
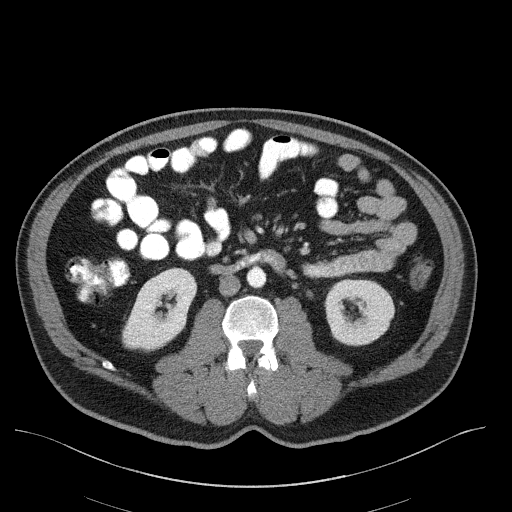
[im 65/110  soft-tissue]
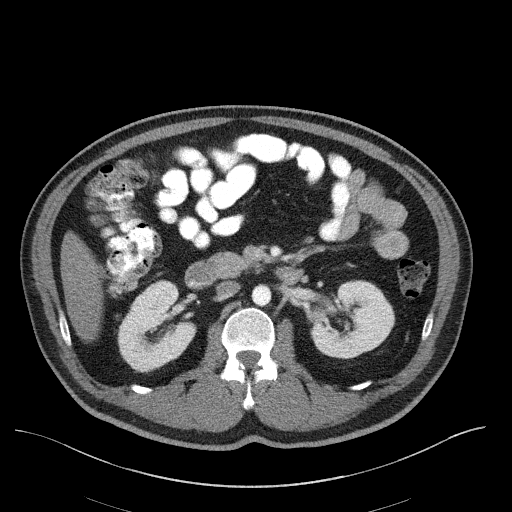
[im 71/110  soft-tissue]
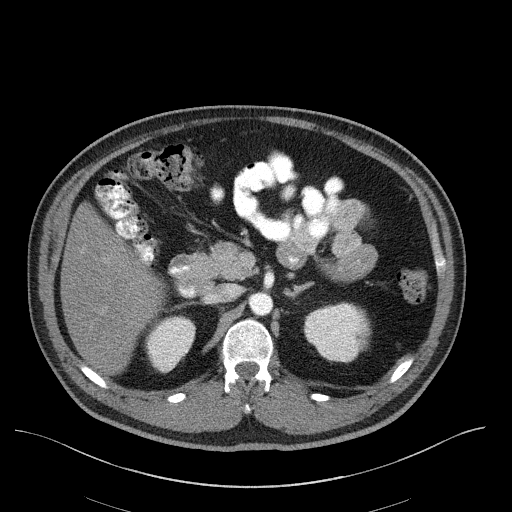
[im 71/110  bone]
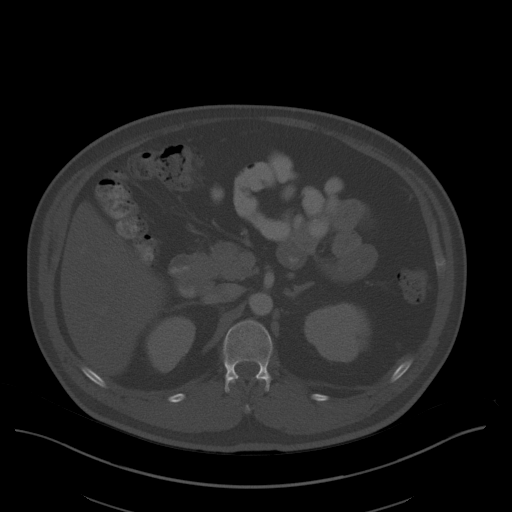
[im 77/110  soft-tissue]
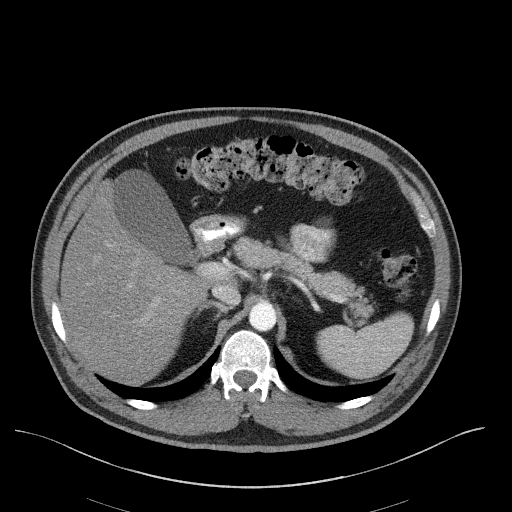
[im 84/110  soft-tissue]
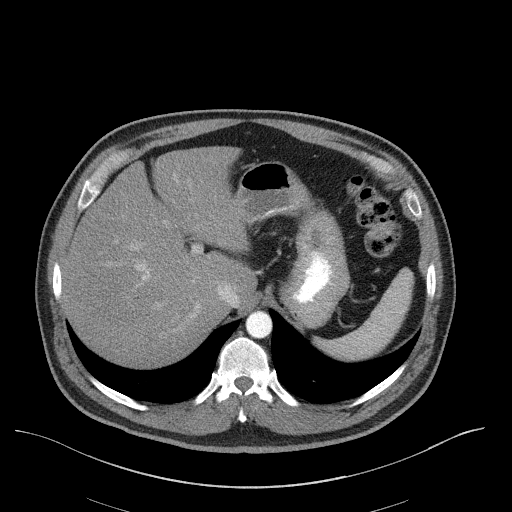
[im 97/110  soft-tissue]
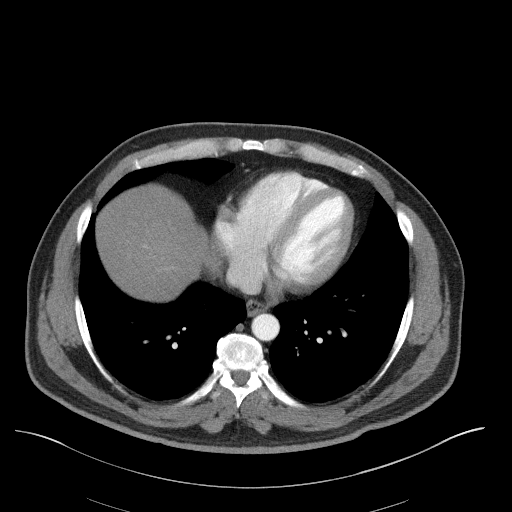
[im 103/110  soft-tissue]
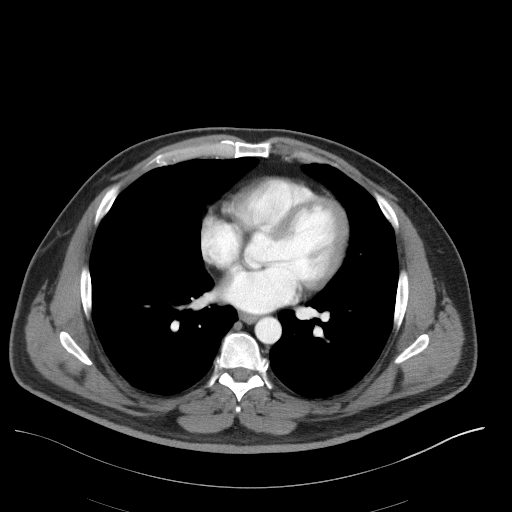

[Series 6: a/p w/ cor · coronal · 0.82mm/px · 3 of 167 slices shown]
[im 56/167  soft-tissue]
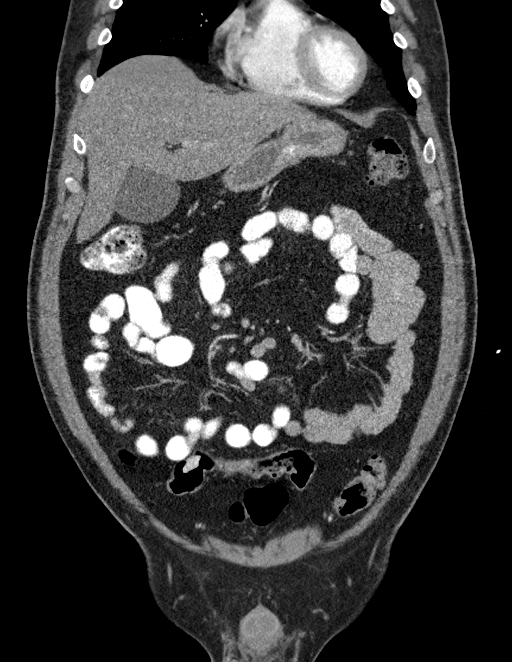
[im 74/167  soft-tissue]
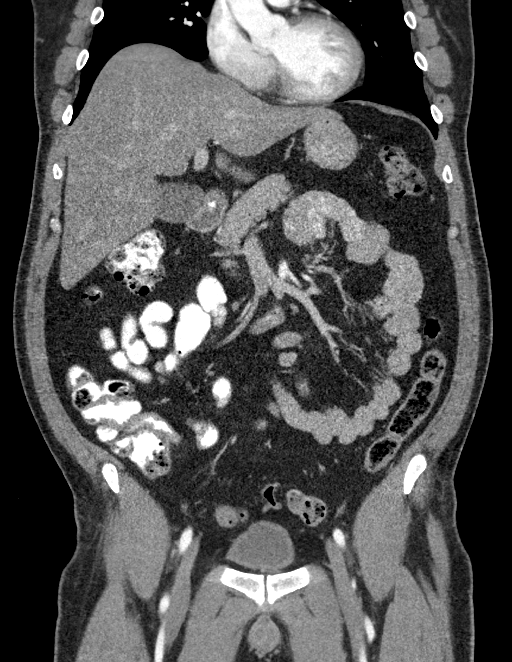
[im 93/167  soft-tissue]
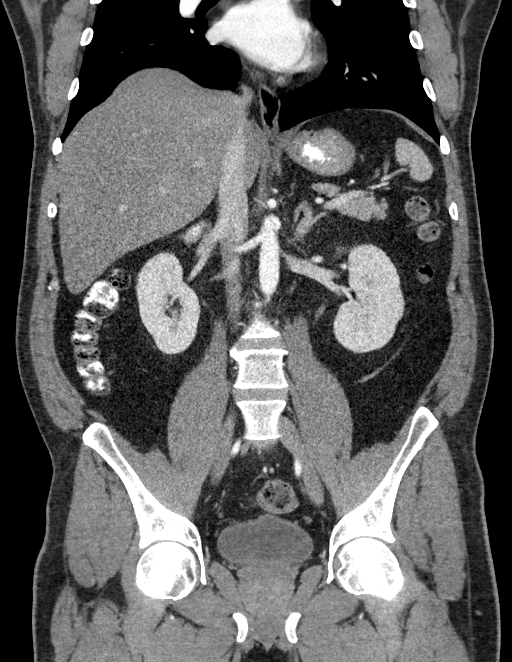

[16 of 46 positions shown; findings below may reference images not displayed]

FINDINGS: Lower chest: Small calcified granuloma in the right middle lobe.
Lungs otherwise clear. Heart normal in size.

Hepatobiliary: Liver demonstrate decreased attenuation consistent
with fatty infiltration. Liver normal size. No mass or focal lesion.
Normal gallbladder. No bile duct dilation.

Pancreas: Unremarkable. No pancreatic ductal dilatation or
surrounding inflammatory changes.

Spleen: Normal in size without focal abnormality.

Adrenals/Urinary Tract: No adrenal masses.

Kidneys normal size, orientation and position with symmetric
enhancement and excretion. Small nonobstructing stone in the midpole
the left kidney. No other intrarenal stones. No hydronephrosis.

Ureters are normal in course and in caliber. Bladder is
unremarkable.

Stomach/Bowel: Stomach, small bowel and colon are within normal
limits. Appendix has been removed.

Vascular/Lymphatic: No significant vascular findings are present. No
enlarged abdominal or pelvic lymph nodes.

Reproductive: Unremarkable.

Other: Small fat containing supraumbilical hernia. No other hernias.
No ascites.

Musculoskeletal: No acute or significant osseous findings.
IMPRESSION: 1. No acute findings within the abdomen or pelvis. No findings to
account for epigastric pain.
2. Small fat containing supraumbilical hernia.
3. Hepatic steatosis.

## 2019-10-19 MED FILL — MELOXICAM 7.5 MG TABLET: 7.5 | 30 days supply | Qty: 30 | Fill #1

## 2019-10-31 MED FILL — JANUMET 50-1,000 MG TABLET: 50-1000 | 90 days supply | Qty: 180 | Fill #1

## 2019-10-31 MED FILL — LISINOPRIL 20 MG TABLET: 20 | 90 days supply | Qty: 90 | Fill #0

## 2019-10-31 MED FILL — SIMVASTATIN 40 MG TABLET: 40 | 90 days supply | Qty: 90 | Fill #0

## 2019-11-22 ENCOUNTER — Other Ambulatory Visit: Payer: Self-pay | Admitting: Nurse Practitioner

## 2019-11-22 DIAGNOSIS — G8929 Other chronic pain: Secondary | ICD-10-CM

## 2019-11-23 MED FILL — MELOXICAM 7.5 MG TABLET: 7.5 | 30 days supply | Qty: 30 | Fill #0

## 2019-12-15 ENCOUNTER — Ambulatory Visit: Payer: Self-pay | Admitting: Nurse Practitioner

## 2019-12-20 ENCOUNTER — Other Ambulatory Visit: Payer: Self-pay | Admitting: Nurse Practitioner

## 2019-12-20 DIAGNOSIS — G8929 Other chronic pain: Secondary | ICD-10-CM

## 2019-12-20 NOTE — Telephone Encounter (Signed)
Copied from CRM [id number]. Topic: Quick Communication - Rx Refill/Question >> Dec 20, 2019  4:43 PM Marylen Ponto wrote: Medication: meloxicam (MOBIC) 7.5 MG tablet  Has the patient contacted their pharmacy? no  Preferred Pharmacy (with phone number or street name): [facility] - [place], [place] - [address]  Phone: [phone]  Fax: [phone]  Agent: Please be advised that RX refills may take up to 3 business days. We ask that you follow-up with your pharmacy.

## 2019-12-22 MED FILL — GLIMEPIRIDE 4 MG TABS: 4 | 30 days supply | Qty: 60 | Fill #0

## 2019-12-22 MED FILL — MELOXICAM 7.5 MG TABLET: 7.5 | 30 days supply | Qty: 30 | Fill #0

## 2020-01-22 ENCOUNTER — Ambulatory Visit: Payer: Self-pay | Attending: Nurse Practitioner | Admitting: Nurse Practitioner

## 2020-01-22 ENCOUNTER — Other Ambulatory Visit: Payer: Self-pay | Admitting: Nurse Practitioner

## 2020-01-22 ENCOUNTER — Encounter: Payer: Self-pay | Admitting: Nurse Practitioner

## 2020-01-22 ENCOUNTER — Other Ambulatory Visit: Payer: Self-pay

## 2020-01-22 VITALS — BP 129/86 | HR 68 | Temp 98.2°F | Ht 67.0 in | Wt 208.4 lb

## 2020-01-22 DIAGNOSIS — G4700 Insomnia, unspecified: Secondary | ICD-10-CM

## 2020-01-22 DIAGNOSIS — G8929 Other chronic pain: Secondary | ICD-10-CM

## 2020-01-22 DIAGNOSIS — I10 Essential (primary) hypertension: Secondary | ICD-10-CM

## 2020-01-22 DIAGNOSIS — E118 Type 2 diabetes mellitus with unspecified complications: Secondary | ICD-10-CM

## 2020-01-22 DIAGNOSIS — M25561 Pain in right knee: Secondary | ICD-10-CM

## 2020-01-22 DIAGNOSIS — K5909 Other constipation: Secondary | ICD-10-CM

## 2020-01-22 LAB — POCT GLYCOSYLATED HEMOGLOBIN (HGB A1C): Hemoglobin A1C: 6.9 % — AB (ref 4.0–5.6)

## 2020-01-22 LAB — GLUCOSE, POCT (MANUAL RESULT ENTRY): POC Glucose: 146 mg/dl — AB (ref 70–99)

## 2020-01-22 MED ORDER — SENNOSIDES-DOCUSATE SODIUM 8.6-50 MG PO TABS: 2.0000 | ORAL_TABLET | Freq: Every day | ORAL | 1 refills | Status: AC

## 2020-01-22 MED ORDER — GLIMEPIRIDE 4 MG PO TABS: 4.0000 mg | ORAL_TABLET | Freq: Every day | ORAL | 1 refills | Status: DC

## 2020-01-22 MED ORDER — MELOXICAM 7.5 MG PO TABS: 7.5000 mg | ORAL_TABLET | Freq: Every day | ORAL | 1 refills | Status: DC

## 2020-01-22 MED ORDER — TRAZODONE HCL 100 MG PO TABS: 50.0000 mg | ORAL_TABLET | Freq: Every day | ORAL | 2 refills | Status: DC

## 2020-01-22 MED FILL — GLIMEPIRIDE 4 MG TABS: 4 | 90 days supply | Qty: 90 | Fill #0

## 2020-01-22 MED FILL — MELOXICAM 7.5 MG TABLET: 7.5 | 90 days supply | Qty: 90 | Fill #0

## 2020-01-22 MED FILL — TRAZODONE HCL 100 MG TABS: 100 | 90 days supply | Qty: 90 | Fill #0

## 2020-01-22 NOTE — Progress Notes (Signed)
Assessment & Plan:  Lance Howell was seen today for follow-up.  Diagnoses and all orders for this visit:  Controlled type 2 diabetes mellitus with complication, without long-term current use of insulin (HCC) -     Glucose (CBG) -     HgB A1c -     glimepiride (AMARYL) 4 MG tablet; Take 1 tablet (4 mg total) by mouth daily with breakfast. Please fill as a 90 day supply -     Basic metabolic panel Continue blood sugar control as discussed in office today, low carbohydrate diet, and regular physical exercise as tolerated, 150 minutes per week (30 min each day, 5 days per week, or 50 min 3 days per week). Keep blood sugar logs with fasting goal of 90-130 mg/dl, post prandial (after you eat) less than 180.  For Hypoglycemia: BS <60 and Hyperglycemia BS >400; contact the clinic ASAP. Annual eye exams and foot exams are recommended.   Chronic pain of right knee -     meloxicam (MOBIC) 7.5 MG tablet; Take 1 tablet (7.5 mg total) by mouth daily. Please fill as a 90 day supply Work on losing weight to help reduce joint pain. May alternate with heat and ice application for pain relief. May also alternate with acetaminophen as prescribed pain relief. Other alternatives include massage, acupuncture and water aerobics.  You must stay active and avoid a sedentary lifestyle.  Essential hypertension -     Basic metabolic panel Continue all antihypertensives as prescribed.  Remember to bring in your blood pressure log with you for your follow up appointment.  DASH/Mediterranean Diets are healthier choices for HTN.    Chronic constipation -     senna-docusate (SENOKOT-S) 8.6-50 MG tablet; Take 2 tablets by mouth at bedtime.  Insomnia, unspecified type -     traZODone (DESYREL) 100 MG tablet; Take 0.5-1 tablets (50-100 mg total) by mouth at bedtime.    Patient has been counseled on age-appropriate routine health concerns for screening and prevention. These are reviewed and up-to-date. Referrals have been  placed accordingly. Immunizations are up-to-date or declined.    Subjective:   Chief Complaint  Patient presents with   Follow-up    Pt. Is here for diabetes follow up.    HPI Lance Howell 49 y.o. male presents to office today for follow up to HTN, DM, HPL.  VRI was used to communicate directly with patient for the entire encounter including providing detailed patient instructions.   DM 2  Well controlled. Taking janumet 50-1000 mg BID and amaryl 4 mg daily as prescribed. Denies any symptoms of hypo or hyperglycemia. LDL not at goal with taking simvastatin 40 mg daily. He is also on ACE.  Lab Results  Component Value Date   HGBA1C 6.9 (A) 01/22/2020   Lab Results  Component Value Date   LDLCALC 84 09/11/2019   Essential Hypertension Will controlled with taking lisinopril 20 mg daily. Denies chest pain, shortness of breath, palpitations, lightheadedness, dizziness, headaches or BLE edema.  BP Readings from Last 3 Encounters:  01/22/20 129/86  05/19/19 102/67  10/24/18 119/79    Chronic Right knee pain States he sustained a right knee injury 4 years ago when a truck tailgate fell on his knee. He had xrays 6 months ago which did not show any abnormalities. Describes pain as a sharp and stabbing.   Constipation He does not have a bowel movement daily. Endorses left sided abdominal pain which subsides after he has a bowel movement.    Insomnia  He endorses difficulty falling asleep and staying asleep. He does not drink any caffeine. He takes melatonin gummies but they are too sweet and he feels they are spiking his blood sugars. Will try trazodone   Review of Systems  Constitutional: Negative for fever, malaise/fatigue and weight loss.  HENT: Negative.  Negative for nosebleeds.   Eyes: Negative.  Negative for blurred vision, double vision and photophobia.  Respiratory: Negative.  Negative for cough and shortness of breath.   Cardiovascular: Negative.  Negative for chest  pain, palpitations and leg swelling.  Gastrointestinal: Positive for constipation. Negative for heartburn, nausea and vomiting.  Musculoskeletal: Positive for joint pain. Negative for myalgias.  Neurological: Negative.  Negative for dizziness, focal weakness, seizures and headaches.  Psychiatric/Behavioral: Negative for suicidal ideas. The patient has insomnia.     Past Medical History:  Diagnosis Date   Diabetes mellitus without complication ([place])    Hyperlipidemia    Hypertension     Past Surgical History:  Procedure Laterality Date   APPENDECTOMY     kidney stone removal      Family History  Problem Relation Age of Onset   Hypertension Mother    Diabetes Father     Social History Reviewed with no changes to be made today.   Outpatient Medications Prior to Visit  Medication Sig Dispense Refill   Blood Glucose Monitoring Suppl (TRUE METRIX METER) w/Device KIT Use as instructed. 1 kit 0   glucose blood test strip Use as instructed. Check blood glucose level by fingerstick twice per day. PLEASE MAIL. 90 day supply 200 each 12   lisinopril (ZESTRIL) 20 MG tablet Take 1 tablet (20 mg total) by mouth daily. PLEASE MAIL. 90 day supply. 90 tablet 1   simvastatin (ZOCOR) 40 MG tablet Take 1 tablet (40 mg total) by mouth daily. PLEASE MAIL. 90 day supply 90 tablet 2   sitaGLIPtin-metformin (JANUMET) 50-1000 MG tablet Take 1 tablet by mouth 2 (two) times daily with a meal. PLEASE MAIL. 90 day supply 180 tablet 2   TRUEplus Lancets 28G MISC Use as instructed. Check blood glucose level by fingerstick twice per day.PLEASE MAIL. 90 day supply 200 each 12   ibuprofen (ADVIL) 600 MG tablet Take 1 tablet (600 mg total) by mouth every 8 (eight) hours as needed. 60 tablet 0   Insulin Pen Needle (B-D UF III MINI PEN NEEDLES) 31G X 5 MM MISC Use as instructed. Inject into the skin once nightly. 100 each 1   meloxicam (MOBIC) 7.5 MG tablet TAKE 1 TABLET (7.5 MG TOTAL) BY MOUTH DAILY.  90 tablet 1   tamsulosin (FLOMAX) 0.4 MG CAPS capsule Take 1 capsule (0.4 mg total) by mouth daily. PLEASE MAIL (Patient not taking: No sig reported) 30 capsule 2   glimepiride (AMARYL) 4 MG tablet Take 2 tablets (8 mg total) by mouth daily with breakfast. Please fill as a 90 day supply 180 tablet 1   insulin glargine (LANTUS) 100 UNIT/ML Solostar Pen Inject 20 Units into the skin daily at 10 pm. (Patient not taking: Reported on 01/22/2020) 15 mL 11   No facility-administered medications prior to visit.    No Known Allergies     Objective:    BP 129/86 (BP Location: Left Arm, Patient Position: Sitting, Cuff Size: Normal)    Pulse 68    Temp 98.2 F (36.8 C) (Oral)    Ht 5' 7"  (1.702 m)    Wt 208 lb 6.4 oz (94.5 kg)    SpO2 97%  BMI 32.64 kg/m  Wt Readings from Last 3 Encounters:  01/22/20 208 lb 6.4 oz (94.5 kg)  05/19/19 210 lb (95.3 kg)  10/24/18 213 lb 6.4 oz (96.8 kg)    Physical Exam Vitals and nursing note reviewed.  Constitutional:      Appearance: He is well-developed and well-nourished.  HENT:     Head: Normocephalic and atraumatic.  Eyes:     Extraocular Movements: EOM normal.  Cardiovascular:     Rate and Rhythm: Normal rate and regular rhythm.     Pulses: Intact distal pulses.     Heart sounds: Normal heart sounds. No murmur heard. No friction rub. No gallop.   Pulmonary:     Effort: Pulmonary effort is normal. No tachypnea or respiratory distress.     Breath sounds: Normal breath sounds. No decreased breath sounds, wheezing, rhonchi or rales.  Chest:     Chest wall: No tenderness.  Abdominal:     General: Bowel sounds are normal.     Palpations: Abdomen is soft.  Musculoskeletal:        General: No swelling, deformity or edema. Normal range of motion.     Cervical back: Normal range of motion.  Skin:    General: Skin is warm and dry.  Neurological:     Mental Status: He is alert and oriented to person, place, and time.     Coordination: Coordination  normal.  Psychiatric:        Mood and Affect: Mood and affect normal.        Behavior: Behavior normal. Behavior is cooperative.        Thought Content: Thought content normal.        Judgment: Judgment normal.          Patient has been counseled extensively about nutrition and exercise as well as the importance of adherence with medications and regular follow-up. The patient was given clear instructions to go to ER or return to medical center if symptoms don't improve, worsen or new problems develop. The patient verbalized understanding.   Follow-up: Return in about 3 months (around 04/21/2020).   Gildardo Pounds, FNP-BC [facility] and [place], [place]   02/04/2020, 12:23 PM

## 2020-01-23 LAB — BASIC METABOLIC PANEL
BUN/Creatinine Ratio: 16 (ref 9–20)
BUN: 13 mg/dL (ref 6–24)
CO2: 25 mmol/L (ref 20–29)
Calcium: 9.8 mg/dL (ref 8.7–10.2)
Chloride: 102 mmol/L (ref 96–106)
Creatinine, Ser: 0.82 mg/dL (ref 0.76–1.27)
GFR calc Af Amer: 121 mL/min/{1.73_m2} (ref >59)
GFR calc non Af Amer: 105 mL/min/{1.73_m2} (ref >59)
Glucose: 141 mg/dL — ABNORMAL HIGH (ref 65–99)
Potassium: 4.6 mmol/L (ref 3.5–5.2)
Sodium: 141 mmol/L (ref 134–144)

## 2020-01-30 MED FILL — LISINOPRIL 20 MG TABLET: 20 | 90 days supply | Qty: 90 | Fill #1

## 2020-01-30 MED FILL — JANUMET 50-1,000 MG TABLET: 50-1000 | 30 days supply | Qty: 60 | Fill #2

## 2020-01-30 MED FILL — SIMVASTATIN 40 MG TABLET: 40 | 90 days supply | Qty: 90 | Fill #1

## 2020-02-04 ENCOUNTER — Encounter: Payer: Self-pay | Admitting: Nurse Practitioner

## 2020-03-06 MED FILL — JANUMET 50-1,000 MG TABLET: 50-1000 | 30 days supply | Qty: 60 | Fill #3

## 2020-04-04 MED FILL — JANUMET 50-1,000 MG TABLET: 50-1000 | 30 days supply | Qty: 60 | Fill #4

## 2020-04-22 ENCOUNTER — Ambulatory Visit: Payer: Self-pay | Admitting: Nurse Practitioner

## 2020-05-09 MED FILL — JANUMET 50-1,000 MG TABLET: 50-1000 | 30 days supply | Qty: 60 | Fill #5

## 2020-05-09 MED FILL — MELOXICAM 7.5 MG TABLET: 7.5 | 90 days supply | Qty: 90 | Fill #1

## 2020-05-10 MED FILL — SIMVASTATIN 40 MG TABLET: 40 | 90 days supply | Qty: 90 | Fill #2

## 2020-05-10 MED FILL — LISINOPRIL 20 MG TABLET: 20 | 90 days supply | Qty: 90 | Fill #1

## 2020-05-31 ENCOUNTER — Other Ambulatory Visit: Payer: Self-pay

## 2020-05-31 MED FILL — Glimepiride Tab 4 MG: ORAL | 90 days supply | Qty: 90 | Fill #0 | Status: AC

## 2020-06-11 ENCOUNTER — Other Ambulatory Visit: Payer: Self-pay

## 2020-06-11 MED FILL — Sitagliptin-Metformin HCl Tab 50-1000 MG: ORAL | 30 days supply | Qty: 60 | Fill #0 | Status: AC

## 2020-06-14 ENCOUNTER — Other Ambulatory Visit: Payer: Self-pay

## 2020-06-14 ENCOUNTER — Ambulatory Visit: Payer: Self-pay | Attending: Nurse Practitioner | Admitting: Internal Medicine

## 2020-06-14 DIAGNOSIS — J069 Acute upper respiratory infection, unspecified: Secondary | ICD-10-CM

## 2020-06-14 MED ORDER — GLIMEPIRIDE 4 MG PO TABS
ORAL_TABLET | ORAL | 1 refills | Status: DC
  Filled 2020-06-14: qty 90, fill #0
  Filled 2020-08-08: qty 90, 90d supply, fill #0

## 2020-06-14 NOTE — Progress Notes (Signed)
Virtual Visit via Telephone Note  I connected with Lance Howell on 06/14/2020 at 4:56 PM by telephone and verified that I am speaking with the correct person using two identifiers  Location: Patient: home Provider: office  Participants: Myself Patient RN: Caleen Jobs [facility] interpreter: [place]   I discussed the limitations, risks, security and privacy concerns of performing an evaluation and management service by telephone and the availability of in person appointments. I also discussed with the patient that there may be a patient responsible charge related to this service. The patient expressed understanding and agreed to proceed.   History of Present Illness: Pt with hx of DM, HTN, HL, insomna.  PCP is NP Raul Del.  This is an urgent care visit.  Patient complains of having the flu with cough for the past 4 to 5 days.  He had subjective fever the first day.  Cough is nonproductive and causes body aches.  He tried taking some over-the-counter cough medication but it caused his blood sugars to be high.  He denies any shortness of breath, sore throat, loss of taste or smell.  Reports that everybody in his house are sick with similar symptoms.  His daughter was diagnosed with the flu earlier this week.  She tested negative for COVID.  He has not been tested for COVID or the flu.  However he is feeling better except for the cough. Requests refill on his diabetic medication.  Current Outpatient Medications  Medication Instructions  . Blood Glucose Monitoring Suppl (TRUE METRIX METER) w/Device KIT Use as instructed.  Marland Kitchen glimepiride (AMARYL) 4 MG tablet TAKE 1 TABLET (4 MG TOTAL) BY MOUTH DAILY WITH BREAKFAST. PLEASE FILL AS A 90 DAY SUPPLY  . glimepiride (AMARYL) 4 MG tablet TAKE 2 TABLETS (8 MG TOTAL) BY MOUTH DAILY WITH BREAKFAST.  Marland Kitchen glimepiride (AMARYL) 4 mg, Oral, Daily with breakfast, Please fill as a 90 day supply  . glucose blood test strip Use as instructed. Check blood  glucose level by fingerstick twice per day. PLEASE MAIL. 90 day supply  . lisinopril (ZESTRIL) 20 MG tablet TAKE 1 TABLET (20 MG TOTAL) BY MOUTH DAILY.  Marland Kitchen lisinopril (ZESTRIL) 20 MG tablet TAKE 1 TABLET (20 MG TOTAL) BY MOUTH DAILY.  Marland Kitchen lisinopril (ZESTRIL) 20 mg, Oral, Daily, PLEASE MAIL. 90 day supply.  . meloxicam (MOBIC) 7.5 MG tablet TAKE 1 TABLET (7.5 MG TOTAL) BY MOUTH DAILY.  . meloxicam (MOBIC) 7.5 MG tablet TAKE 1 TABLET (7.5 MG TOTAL) BY MOUTH DAILY.  . meloxicam (MOBIC) 7.5 mg, Oral, Daily, Please fill as a 90 day supply  . simvastatin (ZOCOR) 40 MG tablet TAKE 1 TABLET (40 MG TOTAL) BY MOUTH DAILY.  . simvastatin (ZOCOR) 40 mg, Oral, Daily, PLEASE MAIL. 90 day supply  . sitaGLIPtin-metformin (JANUMET) 50-1000 MG tablet 1 tablet, Oral, 2 times daily with meals, PLEASE MAIL. 90 day supply  . sitaGLIPtin-metformin (JANUMET) 50-1000 MG tablet TAKE 1 TABLET BY MOUTH 2 (TWO) TIMES DAILY WITH A MEAL.  . tamsulosin (FLOMAX) 0.4 mg, Oral, Daily, PLEASE MAIL  . traZODone (DESYREL) 100 MG tablet TAKE 0.5-1 TABLETS (50-100 MG TOTAL) BY MOUTH AT BEDTIME.  . traZODone (DESYREL) 50-100 mg, Oral, Daily at bedtime  . TRUEplus Lancets 28G MISC Use as instructed. Check blood glucose level by fingerstick twice per day.PLEASE MAIL. 90 day supply      Observations/Objective: No direct observation done as this was a telephone encounter.  Patient did not sound short of breath or congested.  Assessment and Plan:  1. Acute upper respiratory infection Likely he has the flu but likely he is doing better.  I recommend purchasing diabetic [place] over-the-counter and using as needed for the cough.  He can take Tylenol as needed for the body aches. Refill sent on his diabetic medication glimepiride.   Follow Up Instructions: As needed.   I discussed the assessment and treatment plan with the patient. The patient was provided an opportunity to ask questions and all were answered. The patient agreed with the  plan and demonstrated an understanding of the instructions.   The patient was advised to call back or seek an in-person evaluation if the symptoms worsen or if the condition fails to improve as anticipated.  I  Spent 13 minutes on this telephone encounter  Karle Plumber, MD

## 2020-06-15 ENCOUNTER — Other Ambulatory Visit: Payer: Self-pay

## 2020-06-17 ENCOUNTER — Other Ambulatory Visit: Payer: Self-pay

## 2020-07-09 ENCOUNTER — Other Ambulatory Visit: Payer: Self-pay | Admitting: Nurse Practitioner

## 2020-07-09 ENCOUNTER — Other Ambulatory Visit: Payer: Self-pay

## 2020-07-09 MED ORDER — JANUMET 50-1000 MG PO TABS
1.0000 | ORAL_TABLET | Freq: Two times a day (BID) | ORAL | 0 refills | Status: DC
Start: 2020-07-09 — End: 2020-08-23
  Filled 2020-07-09: qty 60, 30d supply, fill #0
  Filled 2020-08-08: qty 60, 30d supply, fill #1

## 2020-07-09 MED ORDER — TRAZODONE HCL 100 MG PO TABS
ORAL_TABLET | ORAL | 0 refills | Status: DC
  Filled 2020-07-09: qty 90, 90d supply, fill #0

## 2020-07-09 NOTE — Telephone Encounter (Signed)
Requested Prescriptions  Pending Prescriptions Disp Refills  . sitaGLIPtin-metformin (JANUMET) 50-1000 MG tablet 180 tablet 0    Sig: TAKE 1 TABLET BY MOUTH 2 (TWO) TIMES DAILY WITH A MEAL.     Endocrinology:  Diabetes - Biguanide + DPP-4 Inhibitor Combos Passed - 07/09/2020  4:12 PM      Passed - HBA1C is between 0 and 7.9 and within 180 days    Hemoglobin A1C  Date Value Ref Range Status  01/22/2020 6.9 (A) 4.0 - 5.6 % Final   Hgb A1c MFr Bld  Date Value Ref Range Status  09/11/2019 8.5 (H) 4.8 - 5.6 % Final    Comment:             Prediabetes: 5.7 - 6.4          Diabetes: >6.4          Glycemic control for adults with diabetes: <7.0          Passed - Cr in normal range and within 360 days    Creatinine, Ser  Date Value Ref Range Status  01/22/2020 0.82 0.76 - 1.27 mg/dL Final         Passed - eGFR in normal range and within 360 days    GFR calc Af Amer  Date Value Ref Range Status  01/22/2020 121 >59 mL/min/1.73 Final    Comment:    **In accordance with recommendations from the NKF-ASN Task force,**   [facility] is in the process of updating its eGFR calculation to the   2021 CKD-EPI creatinine equation that estimates kidney function   without a race variable.    GFR calc non Af Amer  Date Value Ref Range Status  01/22/2020 105 >59 mL/min/1.73 Final         Passed - Valid encounter within last 6 months    Recent Outpatient Visits          3 weeks ago Acute upper respiratory infection   [place], Neoma Laming B, MD   5 months ago Controlled type 2 diabetes mellitus with complication, without long-term current use of insulin [facility])   [place], [place] W, NP   10 months ago Controlled type 2 diabetes mellitus with complication, without long-term current use of insulin [facility])   [place], [place] W, NP   1 year ago Controlled type 2 diabetes mellitus with  complication, without long-term current use of insulin [facility])   [place], [place] W, NP   1 year ago Controlled type 2 diabetes mellitus with complication, without long-term current use of insulin [facility])   [place] Gildardo Pounds, NP      Future Appointments            In 1 month Gildardo Pounds, NP [place]           Signed Prescriptions Disp Refills   traZODone (DESYREL) 100 MG tablet 90 tablet 0    Sig: TAKE 0.5-1 TABLETS (50-100 MG TOTAL) BY MOUTH AT BEDTIME.     Psychiatry: Antidepressants - Serotonin Modulator Passed - 07/09/2020  4:12 PM      Passed - Valid encounter within last 6 months    Recent Outpatient Visits          3 weeks ago Acute upper respiratory infection   [place]  Ladell Pier, MD   5 months ago Controlled type 2 diabetes mellitus with complication, without long-term current use of insulin [facility])   [place], [place] W, NP   10 months ago Controlled type 2 diabetes mellitus with complication, without long-term current use of insulin [facility])   [place], [place] W, NP   1 year ago Controlled type 2 diabetes mellitus with complication, without long-term current use of insulin [facility])   [place], [place] W, NP   1 year ago Controlled type 2 diabetes mellitus with complication, without long-term current use of insulin [facility])   [place], Zelda W, NP      Future Appointments            In 1 month Gildardo Pounds, NP [place]

## 2020-07-09 NOTE — Telephone Encounter (Signed)
Requested Prescriptions  Pending Prescriptions Disp Refills  . traZODone (DESYREL) 100 MG tablet 90 tablet 0    Sig: TAKE 0.5-1 TABLETS (50-100 MG TOTAL) BY MOUTH AT BEDTIME.     Psychiatry: Antidepressants - Serotonin Modulator Passed - 07/09/2020  4:12 PM      Passed - Valid encounter within last 6 months    Recent Outpatient Visits          3 weeks ago Acute upper respiratory infection   [place], Neoma Laming B, MD   5 months ago Controlled type 2 diabetes mellitus with complication, without long-term current use of insulin [facility])   [place], [place] W, NP   10 months ago Controlled type 2 diabetes mellitus with complication, without long-term current use of insulin [facility])   [place], [place] W, NP   1 year ago Controlled type 2 diabetes mellitus with complication, without long-term current use of insulin [facility])   [place], [place] W, NP   1 year ago Controlled type 2 diabetes mellitus with complication, without long-term current use of insulin [facility])   [place], Zelda W, NP      Future Appointments            In 1 month Gildardo Pounds, NP [place]           . sitaGLIPtin-metformin (JANUMET) 50-1000 MG tablet 180 tablet     Sig: TAKE 1 TABLET BY MOUTH 2 (TWO) TIMES DAILY WITH A MEAL.     Endocrinology:  Diabetes - Biguanide + DPP-4 Inhibitor Combos Passed - 07/09/2020  4:12 PM      Passed - HBA1C is between 0 and 7.9 and within 180 days    Hemoglobin A1C  Date Value Ref Range Status  01/22/2020 6.9 (A) 4.0 - 5.6 % Final   Hgb A1c MFr Bld  Date Value Ref Range Status  09/11/2019 8.5 (H) 4.8 - 5.6 % Final    Comment:             Prediabetes: 5.7 - 6.4          Diabetes: >6.4          Glycemic control for adults with diabetes: <7.0          Passed  - Cr in normal range and within 360 days    Creatinine, Ser  Date Value Ref Range Status  01/22/2020 0.82 0.76 - 1.27 mg/dL Final         Passed - eGFR in normal range and within 360 days    GFR calc Af Amer  Date Value Ref Range Status  01/22/2020 121 >59 mL/min/1.73 Final    Comment:    **In accordance with recommendations from the NKF-ASN Task force,**   [facility] is in the process of updating its eGFR calculation to the   2021 CKD-EPI creatinine equation that estimates kidney function   without a race variable.    GFR calc non Af Amer  Date Value Ref Range Status  01/22/2020 105 >59 mL/min/1.73 Final         Passed - Valid encounter within last 6 months    Recent Outpatient Visits          3 weeks ago Acute upper respiratory infection   [place] Ladell Pier, MD  5 months ago Controlled type 2 diabetes mellitus with complication, without long-term current use of insulin [facility])   [place], [place] W, NP   10 months ago Controlled type 2 diabetes mellitus with complication, without long-term current use of insulin [facility])   [place], [place] W, NP   1 year ago Controlled type 2 diabetes mellitus with complication, without long-term current use of insulin [facility])   [place], [place] W, NP   1 year ago Controlled type 2 diabetes mellitus with complication, without long-term current use of insulin [facility])   [place], Zelda W, NP      Future Appointments            In 1 month Gildardo Pounds, NP [place]

## 2020-07-10 ENCOUNTER — Other Ambulatory Visit: Payer: Self-pay

## 2020-07-11 ENCOUNTER — Other Ambulatory Visit: Payer: Self-pay

## 2020-08-08 ENCOUNTER — Other Ambulatory Visit: Payer: Self-pay | Admitting: Nurse Practitioner

## 2020-08-08 ENCOUNTER — Other Ambulatory Visit: Payer: Self-pay

## 2020-08-08 MED ORDER — LISINOPRIL 20 MG PO TABS
ORAL_TABLET | Freq: Every day | ORAL | 0 refills | Status: DC
  Filled 2020-08-08: qty 90, 90d supply, fill #0

## 2020-08-08 MED ORDER — SIMVASTATIN 40 MG PO TABS
ORAL_TABLET | Freq: Every day | ORAL | 0 refills | Status: DC
  Filled 2020-08-08: qty 90, 90d supply, fill #0

## 2020-08-08 MED FILL — Meloxicam Tab 7.5 MG: ORAL | 90 days supply | Qty: 90 | Fill #0 | Status: AC

## 2020-08-09 ENCOUNTER — Other Ambulatory Visit: Payer: Self-pay

## 2020-08-23 ENCOUNTER — Other Ambulatory Visit: Payer: Self-pay

## 2020-08-23 ENCOUNTER — Encounter: Payer: Self-pay | Admitting: Nurse Practitioner

## 2020-08-23 ENCOUNTER — Ambulatory Visit: Payer: Self-pay | Attending: Nurse Practitioner | Admitting: Nurse Practitioner

## 2020-08-23 VITALS — BP 121/79 | HR 62 | Ht 67.0 in | Wt 209.0 lb

## 2020-08-23 DIAGNOSIS — Z1211 Encounter for screening for malignant neoplasm of colon: Secondary | ICD-10-CM

## 2020-08-23 DIAGNOSIS — G8929 Other chronic pain: Secondary | ICD-10-CM

## 2020-08-23 DIAGNOSIS — Z130 Encounter for screening for diseases of the blood and blood-forming organs and certain disorders involving the immune mechanism: Secondary | ICD-10-CM

## 2020-08-23 DIAGNOSIS — I10 Essential (primary) hypertension: Secondary | ICD-10-CM

## 2020-08-23 DIAGNOSIS — E785 Hyperlipidemia, unspecified: Secondary | ICD-10-CM

## 2020-08-23 DIAGNOSIS — M25561 Pain in right knee: Secondary | ICD-10-CM

## 2020-08-23 DIAGNOSIS — E118 Type 2 diabetes mellitus with unspecified complications: Secondary | ICD-10-CM

## 2020-08-23 LAB — GLUCOSE, POCT (MANUAL RESULT ENTRY): POC Glucose: 161 mg/dl — AB (ref 70–99)

## 2020-08-23 LAB — POCT GLYCOSYLATED HEMOGLOBIN (HGB A1C): HbA1c, POC (controlled diabetic range): 7.2 % — AB (ref 0.0–7.0)

## 2020-08-23 MED ORDER — JANUMET 50-1000 MG PO TABS
1.0000 | ORAL_TABLET | Freq: Two times a day (BID) | ORAL | 0 refills | Status: DC
  Filled 2020-08-23: qty 180, 90d supply, fill #0
  Filled 2020-09-10: qty 60, 30d supply, fill #0
  Filled 2020-10-08: qty 60, 30d supply, fill #1
  Filled 2020-11-07: qty 60, 30d supply, fill #2

## 2020-08-23 MED ORDER — SIMVASTATIN 40 MG PO TABS
ORAL_TABLET | Freq: Every day | ORAL | 0 refills | Status: DC
  Filled 2020-08-23: qty 90, fill #0
  Filled 2020-11-07: qty 90, 90d supply, fill #0

## 2020-08-23 MED ORDER — TRUE METRIX METER W/DEVICE KIT
PACK | 0 refills | Status: DC
  Filled 2020-08-23: qty 1, 30d supply, fill #0

## 2020-08-23 MED ORDER — GLUCOSE BLOOD VI STRP
ORAL_STRIP | 12 refills | Status: DC
  Filled 2020-08-23: qty 200, 90d supply, fill #0

## 2020-08-23 MED ORDER — MELOXICAM 7.5 MG PO TABS
ORAL_TABLET | Freq: Every day | ORAL | 1 refills | Status: DC
  Filled 2020-08-23: qty 90, fill #0
  Filled 2020-11-07: qty 90, 90d supply, fill #0

## 2020-08-23 MED ORDER — GLIMEPIRIDE 4 MG PO TABS
ORAL_TABLET | ORAL | 1 refills | Status: DC
Start: 2020-08-23 — End: 2020-12-02
  Filled 2020-08-23: qty 90, fill #0
  Filled 2020-11-07: qty 90, 90d supply, fill #0

## 2020-08-23 MED ORDER — TRUEPLUS LANCETS 28G MISC
12 refills | Status: DC
  Filled 2020-08-23: qty 200, 90d supply, fill #0

## 2020-08-23 MED ORDER — LISINOPRIL 20 MG PO TABS
ORAL_TABLET | Freq: Every day | ORAL | 0 refills | Status: DC
  Filled 2020-08-23: qty 90, fill #0
  Filled 2020-11-07: qty 90, 90d supply, fill #0

## 2020-08-23 NOTE — Progress Notes (Signed)
Assessment & Plan:  Lance Howell was seen today for diabetes.  Diagnoses and all orders for this visit:  Controlled type 2 diabetes mellitus with complication, without long-term current use of insulin (HCC) -     CMP14+EGFR -     glimepiride (AMARYL) 4 MG tablet; TAKE 1 TABLET (4 MG TOTAL) BY MOUTH DAILY WITH BREAKFAST. PLEASE FILL AS A 90 DAY SUPPLY -     Blood Glucose Monitoring Suppl (TRUE METRIX METER) w/Device KIT; Use as instructed. -     glucose blood test strip; Use as instructed. Check blood glucose level by fingerstick twice per day. PLEASE MAIL. 90 day supply -     TRUEplus Lancets 28G MISC; Use as instructed. Check blood glucose level by fingerstick twice per day.PLEASE MAIL. 90 day supply -     sitaGLIPtin-metformin (JANUMET) 50-1000 MG tablet; TAKE 1 TABLET BY MOUTH 2 (TWO) TIMES DAILY WITH A MEAL. -     HgB A1c -     POCT glucose (manual entry) Continue blood sugar control as discussed in office today, low carbohydrate diet, and regular physical exercise as tolerated, 150 minutes per week (30 min each day, 5 days per week, or 50 min 3 days per week). Keep blood sugar logs with fasting goal of 90-130 mg/dl, post prandial (after you eat) less than 180.  For Hypoglycemia: BS <60 and Hyperglycemia BS >400; contact the clinic ASAP. Annual eye exams and foot exams are recommended.   Dyslipidemia, goal LDL below 70 -     Lipid panel -     simvastatin (ZOCOR) 40 MG tablet; TAKE 1 TABLET (40 MG TOTAL) BY MOUTH DAILY. INSTRUCTIONS: Work on a low fat, heart healthy diet and participate in regular aerobic exercise program by working out at least 150 minutes per week; 5 days a week-30 minutes per day. Avoid red meat/beef/steak,  fried foods. junk foods, sodas, sugary drinks, unhealthy snacking, alcohol and smoking.  Drink at least 80 oz of water per day and monitor your carbohydrate intake daily.    Screening for deficiency anemia -     CBC  Colon cancer screening -     Fecal occult blood,  imunochemical([facility])  Primary hypertension -     lisinopril (ZESTRIL) 20 MG tablet; TAKE 1 TABLET (20 MG TOTAL) BY MOUTH DAILY. Continue all antihypertensives as prescribed.  Remember to bring in your blood pressure log with you for your follow up appointment.  DASH/Mediterranean Diets are healthier choices for HTN.    Chronic pain of right knee Well controlled.  -     meloxicam (MOBIC) 7.5 MG tablet; TAKE 1 TABLET (7.5 MG TOTAL) BY MOUTH DAILY.   Patient has been counseled on age-appropriate routine health concerns for screening and prevention. These are reviewed and up-to-date. Referrals have been placed accordingly. Immunizations are up-to-date or declined.    Subjective:   Chief Complaint  Patient presents with   Diabetes   HPI Lance Howell 50 y.o. male presents to office today for DM follow up  DM 2 Taking glimepiride 4 mg daily and Janumet 50-1000 mg twice daily as prescribed.  He denies any symptoms of hypo or hyperglycemia.  LDL not at goal with atorvastatin 20 mg daily.   Glucometer average readings: 7 day 164 14 day 162 30 day 146 Fasting average readings 120-150s at home.  Lab Results  Component Value Date   HGBA1C 7.2 (A) 08/23/2020    Lab Results  Component Value Date   HGBA1C 6.9 (A) 01/22/2020  Lab Results  Component Value Date   LDLCALC 84 09/11/2019     ESSENTIAL HYPERTENSION Blood pressure is well controlled with lisinopril 20 mg daily. Denies chest pain, shortness of breath, palpitations, lightheadedness, dizziness, headaches or BLE edema.   BP Readings from Last 3 Encounters:  08/23/20 121/79  01/22/20 129/86  05/19/19 102/67    Review of Systems  Constitutional:  Negative for fever, malaise/fatigue and weight loss.  HENT: Negative.  Negative for nosebleeds.   Eyes: Negative.  Negative for blurred vision, double vision and photophobia.  Respiratory: Negative.  Negative for cough and shortness of breath.   Cardiovascular:  Negative.  Negative for chest pain, palpitations and leg swelling.  Gastrointestinal: Negative.  Negative for heartburn, nausea and vomiting.  Musculoskeletal:  Positive for joint pain (right knee pain). Negative for myalgias.  Neurological: Negative.  Negative for dizziness, focal weakness, seizures and headaches.  Psychiatric/Behavioral: Negative.  Negative for suicidal ideas.    Past Medical History:  Diagnosis Date   Diabetes mellitus without complication ([place])    Hyperlipidemia    Hypertension     Past Surgical History:  Procedure Laterality Date   APPENDECTOMY     kidney stone removal      Family History  Problem Relation Age of Onset   Hypertension Mother    Diabetes Father     Social History Reviewed with no changes to be made today.   Outpatient Medications Prior to Visit  Medication Sig Dispense Refill   Blood Glucose Monitoring Suppl (TRUE METRIX METER) w/Device KIT Use as instructed. 1 kit 0   glimepiride (AMARYL) 4 MG tablet TAKE 1 TABLET (4 MG TOTAL) BY MOUTH DAILY WITH BREAKFAST. PLEASE FILL AS A 90 DAY SUPPLY 90 tablet 1   glucose blood test strip Use as instructed. Check blood glucose level by fingerstick twice per day. PLEASE MAIL. 90 day supply 200 each 12   lisinopril (ZESTRIL) 20 MG tablet Take 1 tablet (20 mg total) by mouth daily. PLEASE MAIL. 90 day supply. 90 tablet 1   lisinopril (ZESTRIL) 20 MG tablet TAKE 1 TABLET (20 MG TOTAL) BY MOUTH DAILY. 90 tablet 0   meloxicam (MOBIC) 7.5 MG tablet Take 1 tablet (7.5 mg total) by mouth daily. Please fill as a 90 day supply 90 tablet 1   meloxicam (MOBIC) 7.5 MG tablet TAKE 1 TABLET (7.5 MG TOTAL) BY MOUTH DAILY. 90 tablet 1   meloxicam (MOBIC) 7.5 MG tablet TAKE 1 TABLET (7.5 MG TOTAL) BY MOUTH DAILY. 90 tablet 1   simvastatin (ZOCOR) 40 MG tablet Take 1 tablet (40 mg total) by mouth daily. PLEASE MAIL. 90 day supply 90 tablet 2   simvastatin (ZOCOR) 40 MG tablet TAKE 1 TABLET (40 MG TOTAL) BY MOUTH DAILY. 90  tablet 0   sitaGLIPtin-metformin (JANUMET) 50-1000 MG tablet Take 1 tablet by mouth 2 (two) times daily with a meal. PLEASE MAIL. 90 day supply 180 tablet 2   sitaGLIPtin-metformin (JANUMET) 50-1000 MG tablet TAKE 1 TABLET BY MOUTH 2 (TWO) TIMES DAILY WITH A MEAL. 180 tablet 0   TRUEplus Lancets 28G MISC Use as instructed. Check blood glucose level by fingerstick twice per day.PLEASE MAIL. 90 day supply 200 each 12   lisinopril (ZESTRIL) 20 MG tablet TAKE 1 TABLET (20 MG TOTAL) BY MOUTH DAILY. (Patient not taking: Reported on 06/14/2020) 90 tablet 1   tamsulosin (FLOMAX) 0.4 MG CAPS capsule Take 1 capsule (0.4 mg total) by mouth daily. PLEASE MAIL (Patient not taking: No sig reported)  30 capsule 2   traZODone (DESYREL) 100 MG tablet Take 0.5-1 tablets (50-100 mg total) by mouth at bedtime. 30 tablet 2   traZODone (DESYREL) 100 MG tablet TAKE 0.5-1 TABLETS (50-100 MG TOTAL) BY MOUTH AT BEDTIME. (Patient not taking: Reported on 08/23/2020) 90 tablet 0   No facility-administered medications prior to visit.    No Known Allergies     Objective:    BP 121/79   Pulse 62   Ht 5' 7"  (1.702 m)   Wt 209 lb (94.8 kg)   SpO2 99%   BMI 32.73 kg/m  Wt Readings from Last 3 Encounters:  08/23/20 209 lb (94.8 kg)  01/22/20 208 lb 6.4 oz (94.5 kg)  05/19/19 210 lb (95.3 kg)    Physical Exam Vitals and nursing note reviewed.  Constitutional:      Appearance: He is well-developed.  HENT:     Head: Normocephalic and atraumatic.  Cardiovascular:     Rate and Rhythm: Normal rate and regular rhythm.     Heart sounds: Normal heart sounds. No murmur heard.   No friction rub. No gallop.  Pulmonary:     Effort: Pulmonary effort is normal. No tachypnea or respiratory distress.     Breath sounds: Normal breath sounds. No decreased breath sounds, wheezing, rhonchi or rales.  Chest:     Chest wall: No tenderness.  Abdominal:     General: Bowel sounds are normal.     Palpations: Abdomen is soft.   Musculoskeletal:        General: Normal range of motion.     Cervical back: Normal range of motion.  Skin:    General: Skin is warm and dry.  Neurological:     Mental Status: He is alert and oriented to person, place, and time.     Coordination: Coordination normal.  Psychiatric:        Behavior: Behavior normal. Behavior is cooperative.        Thought Content: Thought content normal.        Judgment: Judgment normal.         Patient has been counseled extensively about nutrition and exercise as well as the importance of adherence with medications and regular follow-up. The patient was given clear instructions to go to ER or return to medical center if symptoms don't improve, worsen or new problems develop. The patient verbalized understanding.   Follow-up: Return in about 3 months (around 11/23/2020).   Gildardo Pounds, FNP-BC [facility] [place], [place]   08/23/2020, 9:13 AM

## 2020-08-23 NOTE — Progress Notes (Signed)
Gets pain on top of head. 161 7.2

## 2020-08-24 LAB — LIPID PANEL
Chol/HDL Ratio: 2.8 ratio (ref 0.0–5.0)
Cholesterol, Total: 130 mg/dL (ref 100–199)
HDL: 47 mg/dL (ref >39)
LDL Chol Calc (NIH): 65 mg/dL (ref 0–99)
Triglycerides: 93 mg/dL (ref 0–149)
VLDL Cholesterol Cal: 18 mg/dL (ref 5–40)

## 2020-08-24 LAB — CMP14+EGFR
ALT: 31 IU/L (ref 0–44)
AST: 23 IU/L (ref 0–40)
Albumin/Globulin Ratio: 2.4 — ABNORMAL HIGH (ref 1.2–2.2)
Albumin: 4.7 g/dL (ref 4.0–5.0)
Alkaline Phosphatase: 66 IU/L (ref 44–121)
BUN/Creatinine Ratio: 17 (ref 9–20)
BUN: 14 mg/dL (ref 6–24)
Bilirubin Total: 0.4 mg/dL (ref 0.0–1.2)
CO2: 23 mmol/L (ref 20–29)
Calcium: 9.2 mg/dL (ref 8.7–10.2)
Chloride: 100 mmol/L (ref 96–106)
Creatinine, Ser: 0.83 mg/dL (ref 0.76–1.27)
Globulin, Total: 2 g/dL (ref 1.5–4.5)
Glucose: 168 mg/dL — ABNORMAL HIGH (ref 65–99)
Potassium: 4.2 mmol/L (ref 3.5–5.2)
Sodium: 142 mmol/L (ref 134–144)
Total Protein: 6.7 g/dL (ref 6.0–8.5)
eGFR: 107 mL/min/{1.73_m2} (ref >59)

## 2020-08-24 LAB — CBC
Hematocrit: 46.1 % (ref 37.5–51.0)
Hemoglobin: 15.2 g/dL (ref 13.0–17.7)
MCH: 29.1 pg (ref 26.6–33.0)
MCHC: 33 g/dL (ref 31.5–35.7)
MCV: 88 fL (ref 79–97)
Platelets: 291 10*3/uL (ref 150–450)
RBC: 5.22 x10E6/uL (ref 4.14–5.80)
RDW: 12.8 % (ref 11.6–15.4)
WBC: 7.1 10*3/uL (ref 3.4–10.8)

## 2020-09-10 ENCOUNTER — Other Ambulatory Visit: Payer: Self-pay

## 2020-09-13 LAB — FECAL OCCULT BLOOD, IMMUNOCHEMICAL: Fecal Occult Bld: NEGATIVE

## 2020-10-08 ENCOUNTER — Other Ambulatory Visit: Payer: Self-pay

## 2020-11-07 ENCOUNTER — Other Ambulatory Visit: Payer: Self-pay

## 2020-11-22 ENCOUNTER — Ambulatory Visit: Payer: Self-pay | Admitting: Nurse Practitioner

## 2020-12-02 ENCOUNTER — Ambulatory Visit: Payer: Self-pay | Attending: Nurse Practitioner | Admitting: Physician Assistant

## 2020-12-02 ENCOUNTER — Other Ambulatory Visit: Payer: Self-pay

## 2020-12-02 ENCOUNTER — Encounter: Payer: Self-pay | Admitting: Physician Assistant

## 2020-12-02 VITALS — BP 158/108 | HR 68 | Temp 98.7°F | Resp 18 | Ht 69.0 in | Wt 210.0 lb

## 2020-12-02 DIAGNOSIS — M25561 Pain in right knee: Secondary | ICD-10-CM

## 2020-12-02 DIAGNOSIS — E118 Type 2 diabetes mellitus with unspecified complications: Secondary | ICD-10-CM

## 2020-12-02 DIAGNOSIS — I10 Essential (primary) hypertension: Secondary | ICD-10-CM

## 2020-12-02 DIAGNOSIS — G4700 Insomnia, unspecified: Secondary | ICD-10-CM | POA: Insufficient documentation

## 2020-12-02 DIAGNOSIS — G8929 Other chronic pain: Secondary | ICD-10-CM | POA: Insufficient documentation

## 2020-12-02 DIAGNOSIS — E785 Hyperlipidemia, unspecified: Secondary | ICD-10-CM

## 2020-12-02 DIAGNOSIS — Z6831 Body mass index (BMI) 31.0-31.9, adult: Secondary | ICD-10-CM

## 2020-12-02 DIAGNOSIS — E6609 Other obesity due to excess calories: Secondary | ICD-10-CM

## 2020-12-02 LAB — POCT GLYCOSYLATED HEMOGLOBIN (HGB A1C): Hemoglobin A1C: 7.9 % — AB (ref 4.0–5.6)

## 2020-12-02 MED ORDER — TRAZODONE HCL 50 MG PO TABS
50.0000 mg | ORAL_TABLET | Freq: Every day | ORAL | 0 refills | Status: DC
  Filled 2020-12-02: qty 90, 90d supply, fill #0

## 2020-12-02 MED ORDER — JANUMET 50-1000 MG PO TABS
1.0000 | ORAL_TABLET | Freq: Two times a day (BID) | ORAL | 0 refills | Status: DC
  Filled 2020-12-02: qty 60, 30d supply, fill #0
  Filled 2021-01-10: qty 60, 30d supply, fill #1

## 2020-12-02 MED ORDER — GLIMEPIRIDE 4 MG PO TABS
4.0000 mg | ORAL_TABLET | Freq: Two times a day (BID) | ORAL | 1 refills | Status: DC
  Filled 2020-12-02: qty 180, 90d supply, fill #0
  Filled 2021-04-14: qty 60, 30d supply, fill #0

## 2020-12-02 MED ORDER — SIMVASTATIN 40 MG PO TABS
ORAL_TABLET | Freq: Every day | ORAL | 0 refills | Status: DC
  Filled 2020-12-02: qty 90, fill #0

## 2020-12-02 MED ORDER — MELOXICAM 7.5 MG PO TABS
ORAL_TABLET | Freq: Every day | ORAL | 1 refills | Status: DC
  Filled 2020-12-02: qty 90, fill #0
  Filled 2021-02-10: qty 90, 90d supply, fill #0

## 2020-12-02 MED ORDER — LISINOPRIL 20 MG PO TABS
ORAL_TABLET | Freq: Every day | ORAL | 0 refills | Status: DC
Start: 2020-12-02 — End: 2021-05-13
  Filled 2020-12-02: qty 90, fill #0
  Filled 2021-02-10: qty 90, 90d supply, fill #0

## 2020-12-02 NOTE — Progress Notes (Signed)
Established Patient Office Visit  Subjective:  Patient ID: Lance Howell, male    DOB: 11-Dec-1970  Age: 50 y.o. MRN: [id number]  CC:  Chief Complaint  Patient presents with   Diabetes    HPI Lance Howell presents for medication refills without any concerns.  States that he is taking 50 mg of trazodone with relief of insomnia  States that his BG morning fasting readings have been 160-170.  Reports compliance to medications, states that he works on a diabetic diet.  Does not check blood pressure at home, endorses compliance to medications, denies any hypertensive symptoms.   Does endorse that he was notified of the passing of his father yesterday.  Will be traveling to [place] for the services.   Due to language barrier, an interpreter was present during the history-taking and subsequent discussion (and for part of the physical exam) with this patient.     Past Medical History:  Diagnosis Date   Diabetes mellitus without complication ([place])    Hyperlipidemia    Hypertension     Past Surgical History:  Procedure Laterality Date   APPENDECTOMY     kidney stone removal      Family History  Problem Relation Age of Onset   Hypertension Mother    Diabetes Father     Social History   Socioeconomic History   Marital status: Married    Spouse name: Not on file   Number of children: Not on file   Years of education: Not on file   Highest education level: Not on file  Occupational History   Not on file  Tobacco Use   Smoking status: Never   Smokeless tobacco: Never  Vaping Use   Vaping Use: Never used  Substance and Sexual Activity   Alcohol use: Yes    Comment: occasionally    Drug use: No   Sexual activity: Yes  Other Topics Concern   Not on file  Social History Narrative   Not on file   Social Determinants of Health   Financial Resource Strain: Not on file  Food Insecurity: Not on file  Transportation Needs: Not on file  Physical Activity:  Not on file  Stress: Not on file  Social Connections: Not on file  Intimate Partner Violence: Not on file    Outpatient Medications Prior to Visit  Medication Sig Dispense Refill   Blood Glucose Monitoring Suppl (TRUE METRIX METER) w/Device KIT Use as instructed. 1 kit 0   glucose blood test strip Use as instructed. Check blood glucose level by fingerstick twice per day. PLEASE MAIL. 90 day supply 200 each 12   TRUEplus Lancets 28G MISC Use as instructed. Check blood glucose level by fingerstick twice per day.PLEASE MAIL. 90 day supply 200 each 12   glimepiride (AMARYL) 4 MG tablet TAKE 1 TABLET (4 MG TOTAL) BY MOUTH DAILY WITH BREAKFAST. PLEASE FILL AS A 90 DAY SUPPLY 90 tablet 1   lisinopril (ZESTRIL) 20 MG tablet TAKE 1 TABLET (20 MG TOTAL) BY MOUTH DAILY. 90 tablet 0   meloxicam (MOBIC) 7.5 MG tablet TAKE 1 TABLET (7.5 MG TOTAL) BY MOUTH DAILY. 90 tablet 1   simvastatin (ZOCOR) 40 MG tablet TAKE 1 TABLET (40 MG TOTAL) BY MOUTH DAILY. 90 tablet 0   sitaGLIPtin-metformin (JANUMET) 50-1000 MG tablet TAKE 1 TABLET BY MOUTH 2 (TWO) TIMES DAILY WITH A MEAL. 180 tablet 0   traZODone (DESYREL) 100 MG tablet Take 100 mg by mouth at bedtime.     No  facility-administered medications prior to visit.    No Known Allergies  ROS Review of Systems  Constitutional: Negative.   HENT: Negative.    Eyes: Negative.   Respiratory:  Negative for shortness of breath.   Cardiovascular:  Negative for chest pain.  Gastrointestinal: Negative.   Endocrine: Negative.   Genitourinary: Negative.   Musculoskeletal: Negative.   Skin: Negative.   Allergic/Immunologic: Negative.   Neurological: Negative.   Hematological: Negative.   Psychiatric/Behavioral: Negative.  Negative for self-injury, sleep disturbance and suicidal ideas.      Objective:    Physical Exam Vitals and nursing note reviewed.  Constitutional:      Appearance: Normal appearance.  HENT:     Head: Normocephalic and atraumatic.      Right Ear: External ear normal.     Left Ear: External ear normal.     Nose: Nose normal.     Mouth/Throat:     Mouth: Mucous membranes are moist.     Pharynx: Oropharynx is clear.  Eyes:     Extraocular Movements: Extraocular movements intact.     Conjunctiva/sclera: Conjunctivae normal.     Pupils: Pupils are equal, round, and reactive to light.  Cardiovascular:     Rate and Rhythm: Normal rate and regular rhythm.     Pulses: Normal pulses.     Heart sounds: Normal heart sounds.  Pulmonary:     Effort: Pulmonary effort is normal.     Breath sounds: Normal breath sounds.  Musculoskeletal:        General: Normal range of motion.     Cervical back: Normal range of motion and neck supple.  Skin:    General: Skin is warm and dry.  Neurological:     General: No focal deficit present.     Mental Status: He is alert and oriented to person, place, and time.  Psychiatric:        Mood and Affect: Mood normal.        Behavior: Behavior normal.        Thought Content: Thought content normal.        Judgment: Judgment normal.    BP (!) 158/108 (BP Location: Left Arm, Patient Position: Sitting, Cuff Size: Normal)   Pulse 68   Temp 98.7 F (37.1 C) (Oral)   Resp 18   Ht 5' 9"  (1.753 m)   Wt 210 lb (95.3 kg)   SpO2 100%   BMI 31.01 kg/m  Wt Readings from Last 3 Encounters:  12/02/20 210 lb (95.3 kg)  08/23/20 209 lb (94.8 kg)  01/22/20 208 lb 6.4 oz (94.5 kg)     Health Maintenance Due  Topic Date Due   FOOT EXAM  Never done   COLONOSCOPY (Pts 45-19yr Insurance coverage will need to be confirmed)  Never done   OPHTHALMOLOGY EXAM  10/31/2018   Pneumococcal Vaccine [id number] old (2 - PCV) 11/03/2018   COVID-19 Vaccine (2 - Pfizer series) 06/03/2019   INFLUENZA VACCINE  09/09/2020    There are no preventive care reminders to display for this patient.  No results found for: TSH Lab Results  Component Value Date   WBC 7.1 08/23/2020   HGB 15.2 08/23/2020   HCT 46.1  08/23/2020   MCV 88 08/23/2020   PLT 291 08/23/2020   Lab Results  Component Value Date   NA 142 08/23/2020   K 4.2 08/23/2020   CO2 23 08/23/2020   GLUCOSE 168 (H) 08/23/2020   BUN 14 08/23/2020  CREATININE 0.83 08/23/2020   BILITOT 0.4 08/23/2020   ALKPHOS 66 08/23/2020   AST 23 08/23/2020   ALT 31 08/23/2020   PROT 6.7 08/23/2020   ALBUMIN 4.7 08/23/2020   CALCIUM 9.2 08/23/2020   EGFR 107 08/23/2020   Lab Results  Component Value Date   CHOL 130 08/23/2020   Lab Results  Component Value Date   HDL 47 08/23/2020   Lab Results  Component Value Date   LDLCALC 65 08/23/2020   Lab Results  Component Value Date   TRIG 93 08/23/2020   Lab Results  Component Value Date   CHOLHDL 2.8 08/23/2020   Lab Results  Component Value Date   HGBA1C 7.9 (A) 12/02/2020      Assessment & Plan:   Problem List Items Addressed This Visit       Cardiovascular and Mediastinum   Primary hypertension   Relevant Medications   lisinopril (ZESTRIL) 20 MG tablet   simvastatin (ZOCOR) 40 MG tablet     Endocrine   Controlled type 2 diabetes mellitus with complication, without long-term current use of insulin (HCC) - Primary   Relevant Medications   glimepiride (AMARYL) 4 MG tablet   lisinopril (ZESTRIL) 20 MG tablet   simvastatin (ZOCOR) 40 MG tablet   sitaGLIPtin-metformin (JANUMET) 50-1000 MG tablet   Other Relevant Orders   POCT A1C (Completed)     Other   Dyslipidemia, goal LDL below 70   Relevant Medications   lisinopril (ZESTRIL) 20 MG tablet   simvastatin (ZOCOR) 40 MG tablet   Chronic pain of right knee   Relevant Medications   meloxicam (MOBIC) 7.5 MG tablet   Insomnia   Relevant Medications   traZODone (DESYREL) 50 MG tablet   Class 1 obesity due to excess calories with serious comorbidity and body mass index (BMI) of 31.0 to 31.9 in adult   Relevant Medications   glimepiride (AMARYL) 4 MG tablet   sitaGLIPtin-metformin (JANUMET) 50-1000 MG tablet     Meds ordered this encounter  Medications   glimepiride (AMARYL) 4 MG tablet    Sig: Take 1 tablet (4 mg total) by mouth 2 (two) times daily with a meal.    Dispense:  180 tablet    Refill:  1    Change in dosing    Order Specific Question:   Supervising Provider    Answer:   Asencion Noble E [[id number]]   lisinopril (ZESTRIL) 20 MG tablet    Sig: TAKE 1 TABLET (20 MG TOTAL) BY MOUTH DAILY.    Dispense:  90 tablet    Refill:  0    Order Specific Question:   Supervising Provider    Answer:   Elsie Stain [[id number]]   simvastatin (ZOCOR) 40 MG tablet    Sig: TAKE 1 TABLET (40 MG TOTAL) BY MOUTH DAILY.    Dispense:  90 tablet    Refill:  0    Order Specific Question:   Supervising Provider    Answer:   Elsie Stain [[id number]]   meloxicam (MOBIC) 7.5 MG tablet    Sig: TAKE 1 TABLET (7.5 MG TOTAL) BY MOUTH DAILY.    Dispense:  90 tablet    Refill:  1    Order Specific Question:   Supervising Provider    Answer:   Asencion Noble E [[id number]]   sitaGLIPtin-metformin (JANUMET) 50-1000 MG tablet    Sig: TAKE 1 TABLET BY MOUTH 2 (TWO) TIMES DAILY WITH A MEAL.    Dispense:  180  tablet    Refill:  0    Order Specific Question:   Supervising Provider    Answer:   Elsie Stain [[id number]]   traZODone (DESYREL) 50 MG tablet    Sig: Take 1 tablet (50 mg total) by mouth at bedtime.    Dispense:  90 tablet    Refill:  0    Change in dosing    Order Specific Question:   Supervising Provider    Answer:   Asencion Noble E [[id number]]  1. Controlled type 2 diabetes mellitus with complication, without long-term current use of insulin (HCC) A1c increased from 7.2 three months ago to 7.9.  Increase glimepiride twice daily.  Patient encouraged to check blood glucose levels at home, keep a written log and have available for all office visits.  Patient given follow-up appointment with clinic pharmacist in 1 month.  Patient given appointment with primary care provider in 3 months.  Red flags given for prompt  reevaluation. - glimepiride (AMARYL) 4 MG tablet; Take 1 tablet (4 mg total) by mouth 2 (two) times daily with a meal.  Dispense: 180 tablet; Refill: 1 - sitaGLIPtin-metformin (JANUMET) 50-1000 MG tablet; TAKE 1 TABLET BY MOUTH 2 (TWO) TIMES DAILY WITH A MEAL.  Dispense: 180 tablet; Refill: 0 - POCT A1C  2. Primary hypertension Continue current regimen, patient encouraged to check blood pressure at home on a daily basis, keep a written log and have available for all office visits.  Red flags given for prompt reevaluation. - lisinopril (ZESTRIL) 20 MG tablet; TAKE 1 TABLET (20 MG TOTAL) BY MOUTH DAILY.  Dispense: 90 tablet; Refill: 0  3. Dyslipidemia, goal LDL below 70 Continue current regimen - simvastatin (ZOCOR) 40 MG tablet; TAKE 1 TABLET (40 MG TOTAL) BY MOUTH DAILY.  Dispense: 90 tablet; Refill: 0  4. Chronic pain of right knee Continue current regimen - meloxicam (MOBIC) 7.5 MG tablet; TAKE 1 TABLET (7.5 MG TOTAL) BY MOUTH DAILY.  Dispense: 90 tablet; Refill: 1  5. Insomnia, unspecified type Continue current regimen - traZODone (DESYREL) 50 MG tablet; Take 1 tablet (50 mg total) by mouth at bedtime.  Dispense: 90 tablet; Refill: 0   I have reviewed the patient's medical history (PMH, PSH, Social History, Family History, Medications, and allergies) , and have been updated if relevant. I spent 30 minutes reviewing chart and  face to face time with patient.   Follow-up: Return in about 4 weeks (around 12/30/2020) for [facility].    Loraine Grip Mayers, PA-C

## 2020-12-02 NOTE — Patient Instructions (Addendum)
Your A1c did increase to 7.9.  We will increase the glimepiride, you will now take 4 mg twice daily.  I encourage you to continue checking your blood sugar levels at home, keep a written log and bring those to your office visits.  You will follow-up with the clinic pharmacist in 1 month to review your blood sugar logs.  Once again we are very sorry for your loss.    Roney Jaffe, PA-C Physician Assistant [facility] [email or web address]   Cmo sobrellevar una prdida en los adultos Managing Loss, Adult Las personas vivencian las prdidas de [place] a lo largo de sus vidas. Acontecimientos como una [place], un cambio de [place] y la prdida de amigos puede provocar una sensacin de prdida. La prdida puede ser tan grave como por ejemplo un cambio importante en la salud, un divorcio, la muerte de una mascota o la muerte de un ser querido. Es muy probable que todos estos tipos de prdida causen una reaccin fsica y emocional conocida como duelo. El duelo es el resultado de un cambio grande o la ausencia de algo o alguien que era muy importante para usted. El duelo es una reaccin normal ante la prdida. Diferentes factores pueden afectar su experiencia de duelo, por ejemplo: La naturaleza de la prdida. La relacin que tena con la persona que perdi o con lo que perdi. Su comprensin del duelo y cmo sobrellevarlo. Su red de contencin. Cmo manejar los cambios en el estilo de vida Mantenga su rutina habitual tanto como sea posible. Si tiene dificultad para concentrarse o [employer] [address], est bien que se tome un tiempo fuera de [place] rutina. Pase tiempo con amigos y seres queridos. Siga una dieta saludable, duerma mucho y descanse cuando se sienta cansado. Cmo reconocer los cambios  La manera en que maneje el duelo afectar su capacidad de funcionar como lo hace normalmente. Cuando [employer],  puede experimentar estos cambios: Health and safety inspector, choque, tristeza, ansiedad, enojo, negacin y [place]. Pensamientos sobre la [place]. Llanto repentino. Una sensacin fsica de vaco en el estmago. Problemas para dormir y Arts administrator. Cansancio (fatiga). Prdida de inters en actividades normales. Soar o Engineer, mining a la persona que muri. Una necesidad de recordar a la persona que perdi o lo que perdi. Dificultad para pensar en otra cosa que no sea la prdida durante un tiempo. Alivio. Si ha estado esperando la prdida durante un [place], es probable que sienta alivio cuando suceda. Siga estas instrucciones en su casa: Actividad Exprese sus sentimientos de [employer], como por ejemplo: Hable con otras personas sobre su prdida. Puede ser beneficioso encontrar otras personas que hayan tenido [place] prdida similar, como un grupo de apoyo. Anote sus sentimientos en un diario. Realice actividad fsica para liberar el estrs y la energa emocional. Maricela Curet actividades creativas como pintura, escultura o tocar un instrumento o Optometrist. Practique la resiliencia. Es la capacidad de reponerse y adaptarse despus de superar un obstculo. La lectura de algunas fuentes que incentiven a la resiliencia puede ser de ayuda para aprender las maneras de Museum/gallery conservator.  Instrucciones generales Sea paciente con usted mismo y con los dems. Deje que el proceso de duelo transcurra y recuerde que [place]. Algunos tipos de duelo pueden hacer que sienta que no se termina nunca. Puede encontrar Craig Staggers de seguir adelante sin olvidar ni dejar de tener sentimientos de aprecio por aquello que perdi. Aceptar la prdida es un [place]. Adaptarse puede llevar meses o ms tiempo.  Concurra a todas las visitas de [address] se lo haya indicado el mdico. Esto es importante. Dnde encontrar apoyo Para obtener apoyo para sobrellevar la prdida: Pida al mdico ayuda y recomendaciones, como  asesoramiento o terapia para el duelo. Piense en la posibilidad de unirse a un grupo de apoyo para personas que estn sobrellevando una prdida. Dnde buscar ms informacin Puede encontrar ms informacin sobre cmo sobrellevar una prdida en: Arboriculturist (Sociedad Estadounidense de Oncologa Clnica): www.cancer.net American Psychological Association (Asociacin Estadounidense de Psicologa): DiceTournament.ca Comunquese con un mdico si: El duelo es intenso y [place]. El dolor es constante y no mejora. Es duelo comienza a Network engineer su organismo, por ejemplo, se enferma. Se siente deprimido, ansioso o solo. Solicite ayuda inmediatamente si: Tiene pensamientos acerca de lastimarse a usted mismo o a Economist. Si alguna vez siente que puede lastimarse a usted mismo o a Economist, o tiene pensamientos de poner fin a su vida, busque ayuda de inmediato. Puede dirigirse al servicio de emergencias ms cercano o comunicarse con: Servicio de emergencias de su localidad (911 en EE. UU.). Una lnea de asistencia al suicida y Visual merchandiser en crisis, como National Suicide Prevention Lifeline (Lnea Nacional de Prevencin del Suicidio), al 857-199-2865. Est disponible las 24 horas del da. Resumen El duelo es el resultado de un cambio grande o la ausencia de algo o alguien que era muy importante para usted. El duelo es una reaccin normal ante la prdida. Lo profundo del duelo y el perodo de recuperacin dependen del tipo de prdida as como de la capacidad para adaptarse al cambio y Software engineer los sentimientos. Procesar el duelo requiere ser paciente y estar dispuesto a aceptar sus sentimientos, y Careers adviser la prdida con personas que brindan contencin. Es Forensic scientist los recursos adecuados para usted y Medical illustrator que cada persona vive el duelo de [place]. No existe un mismo proceso de duelo que suceda de la misma [place] para todas [employer]. Tenga en  cuenta que cuando el duelo se torna intenso, puede ocasionar problemas ms graves [place], depresin, ansiedad o pensamientos suicidas. Hable con el mdico si tiene algunos de [employer]. Esta informacin no tiene Theme park manager el consejo del mdico. Asegrese de hacerle al mdico cualquier pregunta que tenga. Document Revised: 07/25/2019 Document Reviewed: 07/25/2019 Elsevier Patient Education  2022 [employer].

## 2020-12-02 NOTE — Progress Notes (Signed)
Patient has not eaten or taken medication today. Patient reports the passing of his father on yesterday. Patient denies pain this time. A1C- 7.9

## 2020-12-27 ENCOUNTER — Telehealth: Payer: Self-pay | Admitting: Nurse Practitioner

## 2020-12-27 ENCOUNTER — Telehealth: Payer: Self-pay | Admitting: *Deleted

## 2020-12-27 NOTE — Telephone Encounter (Signed)
Copied from CRM [id number]. Topic: Quick Communication - Appointment Cancellation >> Dec 26, 2020 11:49 AM Gwenlyn Fudge wrote: Patient called to cancel appointment scheduled for 12/30/20. Patient has not rescheduled their appointment, but is requesting to have a call back to reschedule the appt. Please advise.   Route to department's PEC pool.

## 2020-12-27 NOTE — Telephone Encounter (Signed)
Will call patient to reschedule appt.

## 2020-12-27 NOTE — Telephone Encounter (Signed)
Called pt 2xs to schedule appt for Monday 21st of Nov w/ NP Angus Seller @ [facility], no answer, LVM to call back if pt still needs to be seen asap.

## 2020-12-30 ENCOUNTER — Ambulatory Visit: Payer: Self-pay | Admitting: Pharmacist

## 2021-01-10 ENCOUNTER — Other Ambulatory Visit: Payer: Self-pay

## 2021-01-17 ENCOUNTER — Other Ambulatory Visit: Payer: Self-pay

## 2021-01-17 ENCOUNTER — Ambulatory Visit: Payer: Self-pay | Attending: Nurse Practitioner | Admitting: Pharmacist

## 2021-01-17 ENCOUNTER — Encounter: Payer: Self-pay | Admitting: Pharmacist

## 2021-01-17 DIAGNOSIS — E118 Type 2 diabetes mellitus with unspecified complications: Secondary | ICD-10-CM

## 2021-01-17 MED ORDER — METFORMIN HCL 1000 MG PO TABS
1000.0000 mg | ORAL_TABLET | Freq: Two times a day (BID) | ORAL | 2 refills | Status: DC
  Filled 2021-01-17: qty 60, 30d supply, fill #0
  Filled 2021-02-10: qty 120, 60d supply, fill #1

## 2021-01-17 MED ORDER — TRULICITY 0.75 MG/0.5ML ~~LOC~~ SOAJ
0.7500 mg | SUBCUTANEOUS | 0 refills | Status: DC
Start: 2021-01-17 — End: 2021-02-10
  Filled 2021-01-17: qty 2, 28d supply, fill #0

## 2021-01-17 NOTE — Progress Notes (Signed)
S:     No chief complaint on file.  Patient arrives in good spirits.  Presents for diabetes evaluation, education, and management. Patient was referred on 12/02/2020 by Cari. A1c at that visit was 7.9 so glipizide dose was increased to 4 mg BID. Pt was continued on Janumet.   Patient reports Diabetes was diagnosed ~9 yrs ago. He has never been hospitalized for DM. Denies any hx of pancreatitis. Has never used anything other than oral medication. No history of ACS, stroke, CHF, or CKD. Denies any history of thyroid cancer.    Family/Social History:  -Fhx: HTN, DM -Tobacco: never smoker  -Alcohol: denies use   Insurance coverage/medication affordability: self pay  Medication adherence reported, however, he continues to take glimepiride daily.   Current diabetes medications include: glimepiride 4 mg BID, Janumet 50-1000 mg BID Current hypertension medications include: lisinopril 20 mg daily Current hyperlipidemia medications include: simvastatin 40 mg daily   Patient denies hypoglycemic events.  Patient reported dietary habits: Eats 2 meals/day - Usually eats rice or beans with meals - Eats ~2-5 tortillas daily  -  Admits to loving something sweet with meals but avoids these  - Drinks only water   Patient-reported exercise habits: none outside of work   Patient denies nocturia (nighttime urination).  Patient denies neuropathy (nerve pain). Patient denies visual changes. Patient reports self foot exams.     O:  Physical Exam  ROS  Lab Results  Component Value Date   HGBA1C 7.9 (A) 12/02/2020   There were no vitals filed for this visit.  Lipid Panel     Component Value Date/Time   CHOL 130 08/23/2020 0911   TRIG 93 08/23/2020 0911   HDL 47 08/23/2020 0911   CHOLHDL 2.8 08/23/2020 0911   LDLCALC 65 08/23/2020 0911   Brings his meter.  7 day avg: 200 14 day avg: 199 30 day avg: 188  Home fasting blood sugars: 144 - 223  2 hour post-meal/random blood  sugars: not testing.  Clinical Atherosclerotic Cardiovascular Disease (ASCVD): No  The 10-year ASCVD risk score (Arnett DK, et al., 2019) is: 5.8%   Values used to calculate the score:     Age: 50 years     Sex: Male     Is Non-Hispanic African American: No     Diabetic: Yes     Tobacco smoker: No     Systolic Blood Pressure: 158 mmHg     Is BP treated: Yes     HDL Cholesterol: 47 mg/dL     Total Cholesterol: 130 mg/dL  A/P: Diabetes longstanding currently uncontrolled. Patient is able to verbalize appropriate hypoglycemia management plan. Medication adherence appears okay, however, he never increased the glimepiride dose to BID. Will stop this and Janumet and place him on single agent metformin + Trulicity instead.  -Discontinued glimepiride, Janumet.  -Start metformin 1000 mg BID.  -Start Trulicity 0.75 mg weekly.  -Patient was educated on the use of the Trulicity pen. Reviewed necessary supplies and operation of the pen. Patient was able to demonstrate use. All questions and concerns were addressed. -Extensively discussed pathophysiology of diabetes, recommended lifestyle interventions, dietary effects on blood sugar control -Counseled on s/sx of and management of hypoglycemia -Next A1C anticipated 02/2021.   ASCVD risk - primary prevention in patient with diabetes. Last LDL is controlled on simvastatin. ASCVD risk score is not >20%  - moderate intensity statin indicated. -Continued simvastatin 40 mg for now.    Written patient instructions provided.  Total time in face to face counseling 45 minutes.   Follow up Pharmacist Clinic Visit in 1 month.   Butch Penny, PharmD, P[place]y [place], CPP Clinical Pharmacist [facility] [phone]

## 2021-02-10 ENCOUNTER — Other Ambulatory Visit: Payer: Self-pay

## 2021-02-10 ENCOUNTER — Other Ambulatory Visit: Payer: Self-pay | Admitting: Family Medicine

## 2021-02-10 MED ORDER — TRULICITY 0.75 MG/0.5ML ~~LOC~~ SOAJ
0.7500 mg | SUBCUTANEOUS | 0 refills | Status: DC
  Filled 2021-02-10 (×2): qty 2, 28d supply, fill #0

## 2021-02-21 ENCOUNTER — Other Ambulatory Visit: Payer: Self-pay

## 2021-02-21 ENCOUNTER — Ambulatory Visit: Payer: Self-pay | Attending: Nurse Practitioner | Admitting: Pharmacist

## 2021-02-21 DIAGNOSIS — E118 Type 2 diabetes mellitus with unspecified complications: Secondary | ICD-10-CM

## 2021-02-21 DIAGNOSIS — E785 Hyperlipidemia, unspecified: Secondary | ICD-10-CM

## 2021-02-21 LAB — GLUCOSE, POCT (MANUAL RESULT ENTRY): POC Glucose: 180 mg/dl — AB (ref 70–99)

## 2021-02-21 MED ORDER — TRULICITY 1.5 MG/0.5ML ~~LOC~~ SOAJ
1.5000 mg | SUBCUTANEOUS | 3 refills | Status: DC
Start: 2021-02-21 — End: 2021-04-25
  Filled 2021-02-21: qty 2, 28d supply, fill #0
  Filled 2021-03-18: qty 2, 28d supply, fill #1
  Filled 2021-04-14: qty 2, 28d supply, fill #2

## 2021-02-21 MED ORDER — TRAZODONE HCL 100 MG PO TABS
100.0000 mg | ORAL_TABLET | Freq: Every day | ORAL | 2 refills | Status: DC
  Filled 2021-02-21: qty 30, 30d supply, fill #0
  Filled 2021-03-18: qty 30, 30d supply, fill #1
  Filled 2021-04-14: qty 30, 30d supply, fill #2

## 2021-02-21 MED ORDER — METFORMIN HCL 1000 MG PO TABS
1000.0000 mg | ORAL_TABLET | Freq: Two times a day (BID) | ORAL | 2 refills | Status: DC
  Filled 2021-02-21 – 2021-04-14 (×2): qty 60, 30d supply, fill #0
  Filled 2021-05-12: qty 60, 30d supply, fill #1

## 2021-02-21 MED ORDER — ROSUVASTATIN CALCIUM 5 MG PO TABS
5.0000 mg | ORAL_TABLET | Freq: Every day | ORAL | 2 refills | Status: DC
  Filled 2021-02-21: qty 30, 30d supply, fill #0
  Filled 2021-03-18: qty 30, 30d supply, fill #1
  Filled 2021-04-14: qty 30, 30d supply, fill #2

## 2021-02-21 NOTE — Progress Notes (Signed)
° ° °  S:     No chief complaint on file.  Patient arrives in good spirits.  Presents for diabetes evaluation, education, and management. Patient was referred on 12/02/2020 by Cari. I saw him on 01/17/2021. We stopped Janumet and started metformin and Trulicity.    Denies any NV, abdominal pain since adding Trulicity.   Family/Social History:  -Fhx: HTN, DM -Tobacco: never smoker  -Alcohol: denies use   Insurance coverage/medication affordability: self pay  Medication adherence reported. Current diabetes medications include: metformin 1000 mg BID, Trulicity 0.75 mg weekly Current hypertension medications include: lisinopril 20 mg daily Current hyperlipidemia medications include: simvastatin 40 mg daily   Patient denies hypoglycemic events.  Patient reported dietary habits: Eats 2 meals/day - Usually eats rice or beans with meals - Eats ~2-5 tortillas daily  -  Admits to loving something sweet with meals but avoids these  - Drinks only water   Patient-reported exercise habits: none outside of work   Patient denies nocturia (nighttime urination).  Patient denies neuropathy (nerve pain). Patient denies visual changes. Patient reports self foot exams.    O:  POCT: 180   Lab Results  Component Value Date   HGBA1C 7.9 (A) 12/02/2020   There were no vitals filed for this visit.  Lipid Panel     Component Value Date/Time   CHOL 130 08/23/2020 0911   TRIG 93 08/23/2020 0911   HDL 47 08/23/2020 0911   CHOLHDL 2.8 08/23/2020 0911   LDLCALC 65 08/23/2020 0911   Home fasting blood sugars: 230-250 2 hour post-meal/random blood sugars: 150-170  Clinical Atherosclerotic Cardiovascular Disease (ASCVD): No  The 10-year ASCVD risk score (Arnett DK, et al., 2019) is: 5.8%   Values used to calculate the score:     Age: 50 years     Sex: Male     Is Non-Hispanic African American: No     Diabetic: Yes     Tobacco smoker: No     Systolic Blood Pressure: 158 mmHg     Is BP  treated: Yes     HDL Cholesterol: 47 mg/dL     Total Cholesterol: 130 mg/dL  A/P: Diabetes longstanding currently uncontrolled. Patient is able to verbalize appropriate hypoglycemia management plan. Medication adherence appears okay, however, he never increased the glimepiride dose to BID. Will stop this and Janumet and place him on single agent metformin + Trulicity instead.  -Continue metformin 1000 mg BID.  -Increase Trulicity to 1.5 mg weekly.  -Extensively discussed pathophysiology of diabetes, recommended lifestyle interventions, dietary effects on blood sugar control -Counseled on s/sx of and management of hypoglycemia -Next A1C anticipated 02/2021. Will plan on getting this next month.    ASCVD risk - primary prevention in patient with diabetes. Last LDL is controlled on simvastatin however pt is endorsing muscle cramping. ASCVD risk score is not >20%. Will change simvastatin to rosuvastatin at a low dose to see if this improves the cramping.  -Stop simvastatin.  -Start rosuvastatin 5 mg daily.  Written patient instructions provided.  Total time in face to face counseling 45 minutes.   Follow up Pharmacist Clinic Visit in 1 month.   Butch Penny, PharmD, P[place]y [place], CPP Clinical Pharmacist [facility] [phone]

## 2021-03-18 ENCOUNTER — Other Ambulatory Visit: Payer: Self-pay

## 2021-03-19 ENCOUNTER — Other Ambulatory Visit: Payer: Self-pay

## 2021-03-28 ENCOUNTER — Ambulatory Visit: Payer: Self-pay | Admitting: Pharmacist

## 2021-04-14 ENCOUNTER — Other Ambulatory Visit: Payer: Self-pay | Admitting: Physician Assistant

## 2021-04-14 ENCOUNTER — Other Ambulatory Visit: Payer: Self-pay

## 2021-04-14 DIAGNOSIS — I10 Essential (primary) hypertension: Secondary | ICD-10-CM

## 2021-04-14 DIAGNOSIS — G8929 Other chronic pain: Secondary | ICD-10-CM

## 2021-04-25 ENCOUNTER — Other Ambulatory Visit: Payer: Self-pay

## 2021-04-25 ENCOUNTER — Ambulatory Visit: Payer: Self-pay | Attending: Nurse Practitioner | Admitting: Pharmacist

## 2021-04-25 DIAGNOSIS — E118 Type 2 diabetes mellitus with unspecified complications: Secondary | ICD-10-CM

## 2021-04-25 LAB — POCT GLYCOSYLATED HEMOGLOBIN (HGB A1C): HbA1c, POC (controlled diabetic range): 7.9 % — AB (ref 0.0–7.0)

## 2021-04-25 MED ORDER — TRULICITY 1.5 MG/0.5ML ~~LOC~~ SOAJ
1.5000 mg | SUBCUTANEOUS | 3 refills | Status: DC
  Filled 2021-04-25 – 2021-05-12 (×2): qty 2, 28d supply, fill #0

## 2021-04-25 MED ORDER — EMPAGLIFLOZIN 10 MG PO TABS
10.0000 mg | ORAL_TABLET | Freq: Every day | ORAL | 2 refills | Status: DC
  Filled 2021-04-25: qty 30, 30d supply, fill #0

## 2021-04-25 NOTE — Progress Notes (Signed)
? ? ?  S:    ? ?No chief complaint on file. ? ?Patient arrives in good spirits.  Presents for diabetes evaluation, education, and management. Patient was referred on 12/02/2020 by Cari. I saw him on 01/17/2021. I saw him in January and increased Trulicity to 1.5 mg weekly.  ? ?Denies any NV, abdominal pain since adding Trulicity.  ? ?Family/Social History:  ?-Fhx: HTN, DM ?-Tobacco: never smoker  ?-Alcohol: denies use  ? ?Insurance coverage/medication affordability: self pay ? ?Medication adherence reported. ?Current diabetes medications include: metformin 123XX123 mg BID, Trulicity 1.5 mg weekly ?Current hypertension medications include: lisinopril 20 mg daily ?Current hyperlipidemia medications include: simvastatin 40 mg daily  ? ?Patient denies hypoglycemic events. ? ?Patient reported dietary habits: Eats 2 meals/day ?- Usually eats rice or beans with meals ?- Eats ~2-5 tortillas daily  ?-  Admits to loving something sweet with meals but avoids these  ?- Drinks only water  ? ?Patient-reported exercise habits: none outside of work ?  ?Patient denies nocturia (nighttime urination).  ?Patient denies neuropathy (nerve pain). ?Patient denies visual changes. ?Patient reports self foot exams.  ?  ?O:  ? ?Lab Results  ?Component Value Date  ? HGBA1C 7.9 (A) 04/25/2021  ? ?There were no vitals filed for this visit. ? ?Lipid Panel  ?   ?Component Value Date/Time  ? CHOL 130 08/23/2020 0911  ? TRIG 93 08/23/2020 0911  ? HDL 47 08/23/2020 0911  ? CHOLHDL 2.8 08/23/2020 0911  ? [place] 65 08/23/2020 0911  ? ? ?Clinical Atherosclerotic Cardiovascular Disease (ASCVD): No  ?The 10-year ASCVD risk score (Arnett DK, et al., 2019) is: 5.8% ?  Values used to calculate the score: ?    Age: 51 years ?    Sex: Male ?    Is Non-Hispanic African American: No ?    Diabetic: Yes ?    Tobacco smoker: No ?    Systolic Blood Pressure: 0000000 mmHg ?    Is BP treated: Yes ?    HDL Cholesterol: 47 mg/dL ?    Total Cholesterol: 130 mg/dL ? ?A/P: ?Diabetes  longstanding currently close to goal. Patient is able to verbalize appropriate hypoglycemia management plan. Medication adherence appears to be okay. I favor adding Jardiance but we increased Trulicity in January. At that time, pt was still having some CBGs at home in the [place]. He would prefer to wait to add Jardiance until he's had a little bit more time on the his current regimen. ?-Continue metformin 1000 mg BID.  ?-Continue Trulicity 1.5 mg weekly.  ?-Extensively discussed pathophysiology of diabetes, recommended lifestyle interventions, dietary effects on blood sugar control ?-Counseled on s/sx of and management of hypoglycemia ?-Next A1C anticipated 07/2021. ? ?Written patient instructions provided.  Total time in face to face counseling 45 minutes.   ?Follow up PCP Clinic Visit in 1 month.  ? ?Benard Halsted, PharmD, BCACP, CPP ?Clinical Pharmacist ?[place] ?[phone] ? ? ? ? ?

## 2021-05-12 ENCOUNTER — Other Ambulatory Visit: Payer: Self-pay

## 2021-05-12 ENCOUNTER — Other Ambulatory Visit: Payer: Self-pay | Admitting: Family Medicine

## 2021-05-12 DIAGNOSIS — E785 Hyperlipidemia, unspecified: Secondary | ICD-10-CM

## 2021-05-13 ENCOUNTER — Encounter: Payer: Self-pay | Admitting: Nurse Practitioner

## 2021-05-13 ENCOUNTER — Other Ambulatory Visit: Payer: Self-pay | Admitting: Nurse Practitioner

## 2021-05-13 ENCOUNTER — Telehealth (HOSPITAL_BASED_OUTPATIENT_CLINIC_OR_DEPARTMENT_OTHER): Payer: Self-pay | Admitting: Nurse Practitioner

## 2021-05-13 ENCOUNTER — Other Ambulatory Visit: Payer: Self-pay

## 2021-05-13 DIAGNOSIS — E785 Hyperlipidemia, unspecified: Secondary | ICD-10-CM

## 2021-05-13 DIAGNOSIS — E118 Type 2 diabetes mellitus with unspecified complications: Secondary | ICD-10-CM

## 2021-05-13 DIAGNOSIS — I10 Essential (primary) hypertension: Secondary | ICD-10-CM

## 2021-05-13 DIAGNOSIS — G8929 Other chronic pain: Secondary | ICD-10-CM

## 2021-05-13 MED ORDER — TRULICITY 1.5 MG/0.5ML ~~LOC~~ SOAJ
1.5000 mg | SUBCUTANEOUS | 3 refills | Status: DC
  Filled 2021-05-13 – 2021-06-09 (×2): qty 2, 28d supply, fill #0
  Filled 2021-07-09: qty 2, 28d supply, fill #1
  Filled 2021-08-04: qty 2, 28d supply, fill #2

## 2021-05-13 MED ORDER — GLUCOSE BLOOD VI STRP
ORAL_STRIP | 12 refills | Status: DC
  Filled 2021-05-13: qty 200, 100d supply, fill #0
  Filled 2021-09-23: qty 100, 50d supply, fill #1

## 2021-05-13 MED ORDER — TRUEPLUS LANCETS 28G MISC
12 refills | Status: DC
  Filled 2021-05-13: qty 200, 100d supply, fill #0
  Filled 2021-09-23: qty 100, 50d supply, fill #1

## 2021-05-13 MED ORDER — MELOXICAM 7.5 MG PO TABS
ORAL_TABLET | Freq: Every day | ORAL | 1 refills | Status: DC
  Filled 2021-05-13: qty 90, 90d supply, fill #0
  Filled 2021-09-01: qty 90, 90d supply, fill #1

## 2021-05-13 MED ORDER — METFORMIN HCL 1000 MG PO TABS
1000.0000 mg | ORAL_TABLET | Freq: Two times a day (BID) | ORAL | 2 refills | Status: DC
  Filled 2021-05-13 – 2021-06-16 (×2): qty 60, 30d supply, fill #0
  Filled 2021-07-09: qty 60, 30d supply, fill #1

## 2021-05-13 MED ORDER — LISINOPRIL 20 MG PO TABS
20.0000 mg | ORAL_TABLET | Freq: Every day | ORAL | 3 refills | Status: DC
  Filled 2021-05-13: qty 90, 90d supply, fill #0

## 2021-05-13 MED ORDER — ROSUVASTATIN CALCIUM 5 MG PO TABS
5.0000 mg | ORAL_TABLET | Freq: Every day | ORAL | 2 refills | Status: DC
  Filled 2021-05-13: qty 30, 30d supply, fill #0
  Filled 2021-06-16: qty 30, 30d supply, fill #1
  Filled 2021-07-24: qty 30, 30d supply, fill #2

## 2021-05-13 NOTE — Telephone Encounter (Signed)
Requested Prescriptions  ?Pending Prescriptions Disp Refills  ?? lisinopril (ZESTRIL) 20 MG tablet 90 tablet 0  ?  Sig: TAKE 1 TABLET (20 MG TOTAL) BY MOUTH DAILY.  ?  ? Cardiovascular:  ACE Inhibitors Failed - 05/13/2021 11:40 AM  ?  ?  Failed - Cr in normal range and within 180 days  ?  Creatinine, Ser  ?Date Value Ref Range Status  ?08/23/2020 0.83 0.76 - 1.27 mg/dL Final  ?   ?  ?  Failed - K in normal range and within 180 days  ?  Potassium  ?Date Value Ref Range Status  ?08/23/2020 4.2 3.5 - 5.2 mmol/L Final  ?   ?  ?  Failed - Last BP in normal range  ?  BP Readings from Last 1 Encounters:  ?12/02/20 (!) 158/108  ?   ?  ?  Passed - Patient is not pregnant  ?  ?  Passed - Valid encounter within last 6 months  ?  Recent Outpatient Visits   ?      ? 2 weeks ago Controlled type 2 diabetes mellitus with complication, without long-term current use of insulin ([place])  ? [place], RPH-CPP  ? 2 months ago Controlled type 2 diabetes mellitus with complication, without long-term current use of insulin ([place])  ? [place], RPH-CPP  ? 3 months ago Controlled type 2 diabetes mellitus with complication, without long-term current use of insulin ([place])  ? [place], RPH-CPP  ? 5 months ago Controlled type 2 diabetes mellitus with complication, without long-term current use of insulin ([place])  ? [place], [place]  ? 8 months ago Controlled type 2 diabetes mellitus with complication, without long-term current use of insulin ([place])  ? [place] Gildardo Pounds, NP  ?  ?  ?Future Appointments   ?        ? In 3 months Gildardo Pounds, NP [place]  ?  ? ?  ?  ?  ?? meloxicam (MOBIC) 7.5 MG tablet 90 tablet 1  ?  Sig: TAKE 1 TABLET (7.5 MG TOTAL) BY MOUTH DAILY.  ?  ?  Analgesics:  COX2 Inhibitors Failed - 05/13/2021 11:40 AM  ?  ?  Failed - Manual Review: Labs are only required if the patient has taken medication for more than 8 weeks.  ?  ?  Passed - HGB in normal range and within 360 days  ?  Hemoglobin  ?Date Value Ref Range Status  ?08/23/2020 15.2 13.0 - 17.7 g/dL Final  ?   ?  ?  Passed - Cr in normal range and within 360 days  ?  Creatinine, Ser  ?Date Value Ref Range Status  ?08/23/2020 0.83 0.76 - 1.27 mg/dL Final  ?   ?  ?  Passed - HCT in normal range and within 360 days  ?  Hematocrit  ?Date Value Ref Range Status  ?08/23/2020 46.1 37.5 - 51.0 % Final  ?   ?  ?  Passed - AST in normal range and within 360 days  ?  AST  ?Date Value Ref Range Status  ?08/23/2020 23 0 - 40 IU/L Final  ?   ?  ?  Passed - ALT in normal range and within 360 days  ?  ALT  ?Date Value Ref Range Status  ?08/23/2020 31 0 - 44 IU/L Final  ?   ?  ?  Passed - eGFR is 30 or above and within 360 days  ?  GFR calc Af Amer  ?Date Value Ref Range Status  ?01/22/2020 121 >59 mL/min/1.73 Final  ?  Comment:  ?  **In accordance with recommendations from the NKF-ASN Task force,** ?  [facility] is in the process of updating its eGFR calculation to the ?  2021 CKD-EPI creatinine equation that estimates kidney function ?  without a race variable. ?  ? ?GFR calc non Af Amer  ?Date Value Ref Range Status  ?01/22/2020 105 >59 mL/min/1.73 Final  ? ?eGFR  ?Date Value Ref Range Status  ?08/23/2020 107 >59 mL/min/1.73 Final  ?   ?  ?  Passed - Patient is not pregnant  ?  ?  Passed - Valid encounter within last 12 months  ?  Recent Outpatient Visits   ?      ? 2 weeks ago Controlled type 2 diabetes mellitus with complication, without long-term current use of insulin ([place])  ? [place], RPH-CPP  ? 2 months ago Controlled type 2 diabetes mellitus with complication, without long-term current use of insulin ([place])  ? [place], RPH-CPP  ? 3 months ago Controlled type 2 diabetes mellitus with complication, without long-term current use of insulin ([place])  ? [place], RPH-CPP  ? 5 months ago Controlled type 2 diabetes mellitus with complication, without long-term current use of insulin ([place])  ? [place], [place]  ? 8 months ago Controlled type 2 diabetes mellitus with complication, without long-term current use of insulin ([place])  ? [place] Gildardo Pounds, NP  ?  ?  ?Future Appointments   ?        ? In 3 months Gildardo Pounds, NP [place]  ?  ? ?  ?  ?  ?? rosuvastatin (CRESTOR) 5 MG tablet 30 tablet 2  ?  Sig: Take 1 tablet (5 mg total) by mouth daily.  ?  ? Cardiovascular:  Antilipid - Statins 2 Failed - 05/13/2021 11:40 AM  ?  ?  Failed - Lipid Panel in normal range within the last 12 months  ?  Cholesterol, Total  ?Date Value Ref Range Status  ?08/23/2020 130 100 - 199 mg/dL Final  ? ?LDL Chol Calc (NIH)  ?Date Value Ref Range Status  ?08/23/2020 65 0 - 99 mg/dL Final  ? ?HDL  ?Date Value Ref Range Status  ?08/23/2020 47 >39 mg/dL Final  ? ?Triglycerides  ?Date Value Ref Range Status  ?08/23/2020 93 0 - 149 mg/dL Final  ? ?  ?  ?  Passed - Cr in normal range and within 360 days  ?  Creatinine, Ser  ?Date Value Ref Range Status  ?08/23/2020 0.83 0.76 - 1.27 mg/dL Final  ?   ?  ?  Passed - Patient is not pregnant  ?  ?  Passed - Valid encounter within last 12 months  ?  Recent Outpatient Visits   ?      ? 2 weeks ago Controlled type 2 diabetes mellitus with complication, without long-term current use of insulin ([place])  ? [place], RPH-CPP  ? 2 months ago Controlled type 2 diabetes mellitus with complication, without long-term current use of insulin ([place])  ? [place], RPH-CPP  ? 3 months ago Controlled type 2 diabetes mellitus with complication, without long-term current use of insulin ([place])  ? [place], RPH-CPP  ? 5 months ago Controlled type 2 diabetes mellitus with complication, without long-term current use of insulin ([place])  ? [place], [place]  ? 8 months ago Controlled type 2 diabetes mellitus with complication, without long-term current use of insulin ([place])  ? [place] Gildardo Pounds, NP  ?  ?  ?Future Appointments   ?        ? In 3 months Gildardo Pounds, NP [place]  ?  ? ?  ?  ?  ? ? ?

## 2021-05-13 NOTE — Progress Notes (Signed)
Virtual Visit via Telephone Note ?Due to national recommendations of social distancing due to [place] 19, telehealth visit is felt to be most appropriate for this patient at this time.  I discussed the limitations, risks, security and privacy concerns of performing an evaluation and management service by telephone and the availability of in person appointments. I also discussed with the patient that there may be a patient responsible charge related to this service. The patient expressed understanding and agreed to proceed.  ? ? ?I connected with Lance Howell on 05/13/21  at   4:10 PM EDT  EDT by telephone and verified that I am speaking with the correct person using two identifiers. ? ?Location of Patient: ?Private Residence ?  ?Location of Provider: ?Scientist, research (physical sciences) and [employer] Office  ?  ?Persons participating in Telemedicine visit: ?Geryl Rankins FNP-BC ?Jorge Mandril  ?Spanish Interpreter ANNA [phone] ? ?  ?History of Present Illness: ?Telemedicine visit for: Medication Refills/DM ?She has a past medical history of DM2, Hyperlipidemia, and Hypertension.  ? ? ?DM /HPL/HTN  ?Not well controlled however he states blood glucose levels have been less than 130 on average since starting Trulicity. He also endorses adherence with metformin 1000 mg BID. Lipids at goal with rosuvastatin 5 mg daily.  ?Lab Results  ?Component Value Date  ? HGBA1C 7.9 (A) 04/25/2021  ?  ?Lab Results  ?Component Value Date  ? [place] 65 08/23/2020  ?  ?Currently prescribed lisinopril 20 mg daily.  ?BP Readings from Last 3 Encounters:  ?12/02/20 (!) 158/108  ?08/23/20 121/79  ?01/22/20 129/86  ?  ? ?Past Medical History:  ?Diagnosis Date  ? Diabetes mellitus without complication ([place])   ? Hyperlipidemia   ? Hypertension   ?  ?Past Surgical History:  ?Procedure Laterality Date  ? APPENDECTOMY    ? kidney stone removal    ?  ?Family History  ?Problem Relation Age of Onset  ? Hypertension Mother   ? Diabetes Father   ?  ?Social  History  ? ?Socioeconomic History  ? Marital status: Married  ?  Spouse name: Not on file  ? Number of children: Not on file  ? Years of education: Not on file  ? Highest education level: Not on file  ?Occupational History  ? Not on file  ?Tobacco Use  ? Smoking status: Never  ? Smokeless tobacco: Never  ?Vaping Use  ? Vaping Use: Never used  ?Substance and Sexual Activity  ? Alcohol use: Yes  ?  Comment: occasionally   ? Drug use: No  ? Sexual activity: Yes  ?Other Topics Concern  ? Not on file  ?Social History Narrative  ? Not on file  ? ?Social Determinants of Health  ? ?Financial Resource Strain: Not on file  ?Food Insecurity: Not on file  ?Transportation Needs: Not on file  ?Physical Activity: Not on file  ?Stress: Not on file  ?Social Connections: Not on file  ?  ? ?Observations/Objective: ?Awake, alert and oriented x 3 ? ? ?Review of Systems  ?Constitutional:  Negative for fever, malaise/fatigue and weight loss.  ?HENT: Negative.  Negative for nosebleeds.   ?Eyes: Negative.  Negative for blurred vision, double vision and photophobia.  ?Respiratory: Negative.  Negative for cough and shortness of breath.   ?Cardiovascular: Negative.  Negative for chest pain, palpitations and leg swelling.  ?Gastrointestinal: Negative.  Negative for heartburn, nausea and vomiting.  ?Musculoskeletal: Negative.  Negative for myalgias.  ?Neurological: Negative.  Negative for dizziness, focal weakness, seizures and headaches.  ?  Psychiatric/Behavioral: Negative.  Negative for suicidal ideas.    ?Assessment and Plan: ?Diagnoses and all orders for this visit: ? ?Controlled type 2 diabetes mellitus with complication, without long-term current use of insulin ([place]) ?-     Dulaglutide (TRULICITY) 1.5 0000000 SOPN; Inject 1.5 mg into the skin once a week. ?-     glucose blood test strip; Use as instructed. Check blood glucose level by fingerstick twice per day. PLEASE MAIL. 90 day supply ?-     metFORMIN (GLUCOPHAGE) 1000 MG tablet; Take 1  tablet (1,000 mg total) by mouth 2 (two) times daily with a meal. ?-     TRUEplus Lancets 28G MISC; Use as instructed. Check blood glucose level by fingerstick twice per day.PLEASE MAIL. 90 day supply ? ?Primary hypertension ?-     lisinopril (ZESTRIL) 20 MG tablet; Take 1 tablet (20 mg total) by mouth daily. ?Bring blood pressure log to office visit ? ?Follow Up Instructions ?Return in about 3 months (around 08/12/2021).  ? ?  ?I discussed the assessment and treatment plan with the patient. The patient was provided an opportunity to ask questions and all were answered. The patient agreed with the plan and demonstrated an understanding of the instructions. ?  ?The patient was advised to call back or seek an in-person evaluation if the symptoms worsen or if the condition fails to improve as anticipated. ? ?I provided 12 minutes of non-face-to-face time during this encounter including median intraservice time, reviewing previous notes, labs, imaging, medications and explaining diagnosis and management. ? ?Gildardo Pounds, FNP-BC  ?

## 2021-05-13 NOTE — Telephone Encounter (Signed)
Requested medication (s) are due for refill today: yes ? ?Requested medication (s) are on the active medication list: yes ? ?Last refill:  12/02/20 #90/0 ? ?Future visit scheduled: yes ? ?Notes to clinic:  Unable to refill per protocol due to failed labs, no updated results. ? ? ?  ?Requested Prescriptions  ?Pending Prescriptions Disp Refills  ? lisinopril (ZESTRIL) 20 MG tablet 90 tablet 0  ?  Sig: TAKE 1 TABLET (20 MG TOTAL) BY MOUTH DAILY.  ?  ? Cardiovascular:  ACE Inhibitors Failed - 05/13/2021 11:40 AM  ?  ?  Failed - Cr in normal range and within 180 days  ?  Creatinine, Ser  ?Date Value Ref Range Status  ?08/23/2020 0.83 0.76 - 1.27 mg/dL Final  ?  ?  ?  ?  Failed - K in normal range and within 180 days  ?  Potassium  ?Date Value Ref Range Status  ?08/23/2020 4.2 3.5 - 5.2 mmol/L Final  ?  ?  ?  ?  Failed - Last BP in normal range  ?  BP Readings from Last 1 Encounters:  ?12/02/20 (!) 158/108  ?  ?  ?  ?  Passed - Patient is not pregnant  ?  ?  Passed - Valid encounter within last 6 months  ?  Recent Outpatient Visits   ? ?      ? 2 weeks ago Controlled type 2 diabetes mellitus with complication, without long-term current use of insulin ([place])  ? [place], RPH-CPP  ? 2 months ago Controlled type 2 diabetes mellitus with complication, without long-term current use of insulin ([place])  ? [place], RPH-CPP  ? 3 months ago Controlled type 2 diabetes mellitus with complication, without long-term current use of insulin ([place])  ? [place], RPH-CPP  ? 5 months ago Controlled type 2 diabetes mellitus with complication, without long-term current use of insulin ([place])  ? [place], [place]  ? 8 months ago Controlled type 2 diabetes mellitus with complication, without long-term current use of insulin ([place])  ? [place] Gildardo Pounds, NP  ? ?  ?  ?Future Appointments   ? ?        ? In 3 months Gildardo Pounds, NP [place]  ? ?  ? ?  ?  ?  ?Signed Prescriptions Disp Refills  ? meloxicam (MOBIC) 7.5 MG tablet 90 tablet 1  ?  Sig: TAKE 1 TABLET (7.5 MG TOTAL) BY MOUTH DAILY.  ?  ? Analgesics:  COX2 Inhibitors Failed - 05/13/2021 11:40 AM  ?  ?  Failed - Manual Review: Labs are only required if the patient has taken medication for more than 8 weeks.  ?  ?  Passed - HGB in normal range and within 360 days  ?  Hemoglobin  ?Date Value Ref Range Status  ?08/23/2020 15.2 13.0 - 17.7 g/dL Final  ?  ?  ?  ?  Passed - Cr in normal range and within 360 days  ?  Creatinine, Ser  ?Date Value Ref Range Status  ?08/23/2020 0.83 0.76 - 1.27 mg/dL Final  ?  ?  ?  ?  Passed - HCT in normal range and within 360 days  ?  Hematocrit  ?Date  Value Ref Range Status  ?08/23/2020 46.1 37.5 - 51.0 % Final  ?  ?  ?  ?  Passed - AST in normal range and within 360 days  ?  AST  ?Date Value Ref Range Status  ?08/23/2020 23 0 - 40 IU/L Final  ?  ?  ?  ?  Passed - ALT in normal range and within 360 days  ?  ALT  ?Date Value Ref Range Status  ?08/23/2020 31 0 - 44 IU/L Final  ?  ?  ?  ?  Passed - eGFR is 30 or above and within 360 days  ?  GFR calc Af Amer  ?Date Value Ref Range Status  ?01/22/2020 121 >59 mL/min/1.73 Final  ?  Comment:  ?  **In accordance with recommendations from the NKF-ASN Task force,** ?  [facility] is in the process of updating its eGFR calculation to the ?  2021 CKD-EPI creatinine equation that estimates kidney function ?  without a race variable. ?  ? ?GFR calc non Af Amer  ?Date Value Ref Range Status  ?01/22/2020 105 >59 mL/min/1.73 Final  ? ?eGFR  ?Date Value Ref Range Status  ?08/23/2020 107 >59 mL/min/1.73 Final  ?  ?  ?  ?  Passed - Patient is not pregnant  ?  ?  Passed - Valid encounter within last 12 months  ?  Recent Outpatient Visits   ? ?      ? 2 weeks ago  Controlled type 2 diabetes mellitus with complication, without long-term current use of insulin ([place])  ? [place], RPH-CPP  ? 2 months ago Controlled type 2 diabetes mellitus with complication, without long-term current use of insulin ([place])  ? [place], RPH-CPP  ? 3 months ago Controlled type 2 diabetes mellitus with complication, without long-term current use of insulin ([place])  ? [place], RPH-CPP  ? 5 months ago Controlled type 2 diabetes mellitus with complication, without long-term current use of insulin ([place])  ? [place], [place]  ? 8 months ago Controlled type 2 diabetes mellitus with complication, without long-term current use of insulin ([place])  ? [place] Gildardo Pounds, NP  ? ?  ?  ?Future Appointments   ? ?        ? In 3 months Gildardo Pounds, NP [place]  ? ?  ? ?  ?  ?  ? rosuvastatin (CRESTOR) 5 MG tablet 30 tablet 2  ?  Sig: Take 1 tablet (5 mg total) by mouth daily.  ?  ? Cardiovascular:  Antilipid - Statins 2 Failed - 05/13/2021 11:40 AM  ?  ?  Failed - Lipid Panel in normal range within the last 12 months  ?  Cholesterol, Total  ?Date Value Ref Range Status  ?08/23/2020 130 100 - 199 mg/dL Final  ? ?LDL Chol Calc (NIH)  ?Date Value Ref Range Status  ?08/23/2020 65 0 - 99 mg/dL Final  ? ?HDL  ?Date Value Ref Range Status  ?08/23/2020 47 >39 mg/dL Final  ? ?Triglycerides  ?Date Value Ref Range Status  ?08/23/2020 93 0 - 149 mg/dL Final  ? ?  ?  ?  Passed - Cr in normal range and within 360 days  ?  Creatinine, Ser  ?  Date Value Ref Range Status  ?08/23/2020 0.83 0.76 - 1.27 mg/dL Final  ?  ?  ?  ?  Passed - Patient is not pregnant  ?  ?  Passed - Valid encounter within last 12 months  ?  Recent Outpatient Visits   ? ?      ? 2  weeks ago Controlled type 2 diabetes mellitus with complication, without long-term current use of insulin ([place])  ? [place], RPH-CPP  ? 2 months ago Controlled type 2 diabetes mellitus with complication, without long-term current use of insulin ([place])  ? [place], RPH-CPP  ? 3 months ago Controlled type 2 diabetes mellitus with complication, without long-term current use of insulin ([place])  ? [place], RPH-CPP  ? 5 months ago Controlled type 2 diabetes mellitus with complication, without long-term current use of insulin ([place])  ? [place], [place]  ? 8 months ago Controlled type 2 diabetes mellitus with complication, without long-term current use of insulin ([place])  ? [place] Gildardo Pounds, NP  ? ?  ?  ?Future Appointments   ? ?        ? In 3 months Gildardo Pounds, NP [place]  ? ?  ? ?  ?  ?  ? ?

## 2021-05-14 ENCOUNTER — Other Ambulatory Visit: Payer: Self-pay

## 2021-05-20 ENCOUNTER — Other Ambulatory Visit: Payer: Self-pay | Admitting: Nurse Practitioner

## 2021-05-20 ENCOUNTER — Other Ambulatory Visit: Payer: Self-pay

## 2021-05-21 ENCOUNTER — Other Ambulatory Visit: Payer: Self-pay

## 2021-05-21 MED ORDER — TRAZODONE HCL 100 MG PO TABS
100.0000 mg | ORAL_TABLET | Freq: Every day | ORAL | 2 refills | Status: DC
Start: 2021-05-21 — End: 2021-10-03
  Filled 2021-05-21: qty 30, 30d supply, fill #0
  Filled 2021-06-17: qty 30, 30d supply, fill #1
  Filled 2021-07-24: qty 30, 30d supply, fill #2

## 2021-05-21 NOTE — Telephone Encounter (Signed)
Requested Prescriptions  ?Pending Prescriptions Disp Refills  ?? traZODone (DESYREL) 100 MG tablet 30 tablet 2  ?  Sig: Take 1 tablet (100 mg total) by mouth at bedtime.  ?  ? Psychiatry: Antidepressants - Serotonin Modulator Passed - 05/20/2021 12:52 PM  ?  ?  Passed - Valid encounter within last 6 months  ?  Recent Outpatient Visits   ?      ? 1 week ago Controlled type 2 diabetes mellitus with complication, without long-term current use of insulin (HCC)  ? [facility] [place], [place] W, NP  ? 3 weeks ago Controlled type 2 diabetes mellitus with complication, without long-term current use of insulin (HCC)  ? [facility] [place], Cornelius Moras, RPH-CPP  ? 2 months ago Controlled type 2 diabetes mellitus with complication, without long-term current use of insulin (HCC)  ? [facility] [place], Cornelius Moras, RPH-CPP  ? 4 months ago Controlled type 2 diabetes mellitus with complication, without long-term current use of insulin (HCC)  ? [facility] [place], Cornelius Moras, RPH-CPP  ? 5 months ago Controlled type 2 diabetes mellitus with complication, without long-term current use of insulin (HCC)  ? [facility] [place], [place], [place]  ?  ?  ?Future Appointments   ?        ? In 2 months Claiborne Rigg, NP [facility]  ?  ? ?  ?  ?  ? ? ?

## 2021-05-23 ENCOUNTER — Other Ambulatory Visit: Payer: Self-pay

## 2021-06-09 ENCOUNTER — Other Ambulatory Visit: Payer: Self-pay

## 2021-06-16 ENCOUNTER — Other Ambulatory Visit: Payer: Self-pay

## 2021-06-17 ENCOUNTER — Other Ambulatory Visit: Payer: Self-pay

## 2021-07-09 ENCOUNTER — Other Ambulatory Visit: Payer: Self-pay

## 2021-07-24 ENCOUNTER — Other Ambulatory Visit: Payer: Self-pay

## 2021-08-04 ENCOUNTER — Other Ambulatory Visit: Payer: Self-pay

## 2021-08-15 ENCOUNTER — Ambulatory Visit: Payer: Self-pay | Attending: Nurse Practitioner | Admitting: Nurse Practitioner

## 2021-08-15 ENCOUNTER — Other Ambulatory Visit: Payer: Self-pay | Admitting: Nurse Practitioner

## 2021-08-15 ENCOUNTER — Encounter: Payer: Self-pay | Admitting: Nurse Practitioner

## 2021-08-15 ENCOUNTER — Other Ambulatory Visit: Payer: Self-pay

## 2021-08-15 VITALS — BP 140/98 | HR 84 | Temp 98.4°F | Resp 16 | Wt 202.0 lb

## 2021-08-15 DIAGNOSIS — G43C0 Periodic headache syndromes in child or adult, not intractable: Secondary | ICD-10-CM

## 2021-08-15 DIAGNOSIS — E118 Type 2 diabetes mellitus with unspecified complications: Secondary | ICD-10-CM

## 2021-08-15 DIAGNOSIS — I10 Essential (primary) hypertension: Secondary | ICD-10-CM

## 2021-08-15 DIAGNOSIS — E785 Hyperlipidemia, unspecified: Secondary | ICD-10-CM

## 2021-08-15 DIAGNOSIS — Z130 Encounter for screening for diseases of the blood and blood-forming organs and certain disorders involving the immune mechanism: Secondary | ICD-10-CM

## 2021-08-15 LAB — POCT GLYCOSYLATED HEMOGLOBIN (HGB A1C): HbA1c, POC (controlled diabetic range): 8.7 % — AB (ref 0.0–7.0)

## 2021-08-15 LAB — GLUCOSE, POCT (MANUAL RESULT ENTRY): POC Glucose: 179 mg/dl — AB (ref 70–99)

## 2021-08-15 MED ORDER — TRULICITY 1.5 MG/0.5ML ~~LOC~~ SOAJ
3.0000 mg | SUBCUTANEOUS | 0 refills | Status: DC
  Filled 2021-08-15: qty 4, 28d supply, fill #0
  Filled 2021-09-01: qty 4, 28d supply, fill #1
  Filled 2021-10-03: qty 2, 28d supply, fill #2

## 2021-08-15 MED ORDER — METFORMIN HCL 1000 MG PO TABS
1000.0000 mg | ORAL_TABLET | Freq: Two times a day (BID) | ORAL | 1 refills | Status: DC
  Filled 2021-08-15: qty 60, 30d supply, fill #0
  Filled 2021-09-16 – 2021-09-23 (×2): qty 60, 30d supply, fill #1
  Filled 2021-10-20: qty 60, 30d supply, fill #2
  Filled 2021-11-26: qty 60, 30d supply, fill #3
  Filled 2021-12-30: qty 60, 30d supply, fill #4
  Filled 2022-02-06: qty 60, 30d supply, fill #5

## 2021-08-15 MED ORDER — TRULICITY 3 MG/0.5ML ~~LOC~~ SOAJ
3.0000 mg | SUBCUTANEOUS | 0 refills | Status: DC
  Filled 2021-08-15 – 2021-10-03 (×2): qty 6, 84d supply, fill #0

## 2021-08-15 MED ORDER — BUTALBITAL-APAP-CAFFEINE 50-325-40 MG PO TABS
1.0000 | ORAL_TABLET | Freq: Four times a day (QID) | ORAL | 1 refills | Status: DC | PRN
  Filled 2021-08-15: qty 30, 4d supply, fill #0
  Filled 2022-01-19: qty 30, 4d supply, fill #1

## 2021-08-15 MED ORDER — LISINOPRIL 40 MG PO TABS
40.0000 mg | ORAL_TABLET | Freq: Every day | ORAL | 3 refills | Status: DC
  Filled 2021-08-15: qty 30, 30d supply, fill #0
  Filled 2021-09-16 – 2021-09-23 (×2): qty 30, 30d supply, fill #1
  Filled 2021-10-20 (×2): qty 30, 30d supply, fill #2
  Filled 2021-11-26: qty 30, 30d supply, fill #3
  Filled 2021-12-30: qty 30, 30d supply, fill #4
  Filled 2022-02-03: qty 30, 30d supply, fill #5

## 2021-08-15 NOTE — Progress Notes (Signed)
Assessment & Plan:  Lance Howell was seen today for diabetes.  Diagnoses and all orders for this visit:  Controlled type 2 diabetes mellitus with complication, without long-term current use of insulin  -     CMP14+EGFR -     Microalbumin / creatinine urine ratio -     Glucose (CBG) -     HgB A1c -     Dulaglutide (TRULICITY) 3 JE/5.6DJ SOPN; Inject 3 mg as directed once a week. -     metFORMIN (GLUCOPHAGE) 1000 MG tablet; Take 1 tablet (1,000 mg total) by mouth 2 (two) times daily with a meal. -     Ambulatory referral to Ophthalmology  Dyslipidemia, goal LDL below 70 -     Lipid panel  Screening for deficiency anemia -     CBC  Primary hypertension BLOOD PRESSURE NOT AT GOAL INCREASED BP MED TODAY-     lisinopril (ZESTRIL) 40 MG tablet; Take 1 tablet (40 mg total) by mouth daily.   Periodic headache syndrome, not intractable -     butalbital-acetaminophen-caffeine (FIORICET) 50-325-40 MG tablet; Take 1-2 tablets by mouth every 6 (six) hours as needed for headache.    Patient has been counseled on age-appropriate routine health concerns for screening and prevention. These are reviewed and up-to-date. Referrals have been placed accordingly. Immunizations are up-to-date or declined.    Subjective:   Chief Complaint  Patient presents with   Diabetes   HPI Lance Howell 51 y.o. male presents to office today for follow up to DM and HTN.  VRI was used to communicate directly with patient for the entire encounter including providing detailed patient instructions.     HTN Blood pressure is not well controlled. He is taking lisinopril 20 mg daily. Will increase to 40 mg daily.  BP Readings from Last 3 Encounters:  08/15/21 (!) 140/98  12/02/20 (!) 158/108  08/23/20 121/79       DM 2 Checking blood sugars only once in the morning. Readings are high at home. I have recommended he also check post prandial readings. Will increase Trulicity from 1.5 mg weekly to 3 mg weekly.  He will continue on metformin 1000 mg BID.  Lab Results  Component Value Date   HGBA1C 8.7 (A) 08/15/2021    Lab Results  Component Value Date   HGBA1C 7.9 (A) 04/25/2021     Endorses frequent headaches. Taking tylenol OTC. Frontal and occipital headaches. Headaches can last for several hours and are occurring a few times per week. Feels his headaches are related to his higher blood glucose readings. Also overdue for eye exam.      Review of Systems  Constitutional:  Negative for fever, malaise/fatigue and weight loss.  HENT: Negative.  Negative for nosebleeds.   Eyes:  Positive for blurred vision. Negative for double vision and photophobia.  Respiratory: Negative.  Negative for cough and shortness of breath.   Cardiovascular: Negative.  Negative for chest pain, palpitations and leg swelling.  Gastrointestinal: Negative.  Negative for heartburn, nausea and vomiting.  Musculoskeletal: Negative.  Negative for myalgias.  Neurological:  Positive for headaches. Negative for dizziness, focal weakness and seizures.  Psychiatric/Behavioral: Negative.  Negative for suicidal ideas.     Past Medical History:  Diagnosis Date   Diabetes mellitus without complication ([place])    Hyperlipidemia    Hypertension     Past Surgical History:  Procedure Laterality Date   APPENDECTOMY     kidney stone removal      Family  History  Problem Relation Age of Onset   Hypertension Mother    Diabetes Father     Social History Reviewed with no changes to be made today.   Outpatient Medications Prior to Visit  Medication Sig Dispense Refill   Blood Glucose Monitoring Suppl (TRUE METRIX METER) w/Device KIT Use as instructed. 1 kit 0   glucose blood test strip Use as instructed. Check blood glucose level by fingerstick twice per day. PLEASE MAIL. 90 day supply 200 each 12   meloxicam (MOBIC) 7.5 MG tablet TAKE 1 TABLET (7.5 MG TOTAL) BY MOUTH DAILY. 90 tablet 1   rosuvastatin (CRESTOR) 5 MG tablet  Take 1 tablet (5 mg total) by mouth daily. 30 tablet 2   traZODone (DESYREL) 100 MG tablet Take 1 tablet (100 mg total) by mouth at bedtime. 30 tablet 2   TRUEplus Lancets 28G MISC Use as instructed. Check blood glucose level by fingerstick twice per day.PLEASE MAIL. 90 day supply 200 each 12   Dulaglutide (TRULICITY) 1.5 UE/2.8MK SOPN Inject 1.5 mg into the skin once a week. 2 mL 3   metFORMIN (GLUCOPHAGE) 1000 MG tablet Take 1 tablet (1,000 mg total) by mouth 2 (two) times daily with a meal. 60 tablet 2   lisinopril (ZESTRIL) 20 MG tablet Take 1 tablet (20 mg total) by mouth daily. 90 tablet 3   No facility-administered medications prior to visit.    No Known Allergies     Objective:    BP (!) 140/98   Pulse 84   Temp 98.4 F (36.9 C) (Oral)   Resp 16   Wt 202 lb (91.6 kg)   SpO2 96%   BMI 29.83 kg/m  Wt Readings from Last 3 Encounters:  08/15/21 202 lb (91.6 kg)  12/02/20 210 lb (95.3 kg)  08/23/20 209 lb (94.8 kg)    Physical Exam Vitals and nursing note reviewed.  Constitutional:      Appearance: He is well-developed.  HENT:     Head: Normocephalic and atraumatic.  Cardiovascular:     Rate and Rhythm: Normal rate and regular rhythm.     Heart sounds: Normal heart sounds. No murmur heard.    No friction rub. No gallop.  Pulmonary:     Effort: Pulmonary effort is normal. No tachypnea or respiratory distress.     Breath sounds: Normal breath sounds. No decreased breath sounds, wheezing, rhonchi or rales.  Chest:     Chest wall: No tenderness.  Abdominal:     General: Bowel sounds are normal.     Palpations: Abdomen is soft.  Musculoskeletal:        General: Normal range of motion.     Cervical back: Normal range of motion.  Skin:    General: Skin is warm and dry.  Neurological:     Mental Status: He is alert and oriented to person, place, and time.     Coordination: Coordination normal.  Psychiatric:        Behavior: Behavior normal. Behavior is cooperative.         Thought Content: Thought content normal.        Judgment: Judgment normal.          Patient has been counseled extensively about nutrition and exercise as well as the importance of adherence with medications and regular follow-up. The patient was given clear instructions to go to ER or return to medical center if symptoms don't improve, worsen or new problems develop. The patient verbalized understanding.   Follow-up:  Return in about 3 months (around 11/15/2021).   Gildardo Pounds, FNP-BC [facility] [place], [place]   08/15/2021, 5:02 PM

## 2021-08-16 LAB — CMP14+EGFR
ALT: 30 IU/L (ref 0–44)
AST: 22 IU/L (ref 0–40)
Albumin/Globulin Ratio: 2 (ref 1.2–2.2)
Albumin: 5.1 g/dL — ABNORMAL HIGH (ref 4.0–5.0)
Alkaline Phosphatase: 81 IU/L (ref 44–121)
BUN/Creatinine Ratio: 18 (ref 9–20)
BUN: 15 mg/dL (ref 6–24)
Bilirubin Total: <0.2 mg/dL (ref 0.0–1.2)
CO2: 24 mmol/L (ref 20–29)
Calcium: 10.2 mg/dL (ref 8.7–10.2)
Chloride: 102 mmol/L (ref 96–106)
Creatinine, Ser: 0.84 mg/dL (ref 0.76–1.27)
Globulin, Total: 2.5 g/dL (ref 1.5–4.5)
Glucose: 168 mg/dL — ABNORMAL HIGH (ref 70–99)
Potassium: 4.4 mmol/L (ref 3.5–5.2)
Sodium: 142 mmol/L (ref 134–144)
Total Protein: 7.6 g/dL (ref 6.0–8.5)
eGFR: 106 mL/min/{1.73_m2} (ref >59)

## 2021-08-16 LAB — MICROALBUMIN / CREATININE URINE RATIO
Creatinine, Urine: 101.5 mg/dL
Microalb/Creat Ratio: 100 mg/g creat — ABNORMAL HIGH (ref 0–29)
Microalbumin, Urine: 101.9 ug/mL

## 2021-08-16 LAB — CBC
Hematocrit: 46.3 % (ref 37.5–51.0)
Hemoglobin: 15.1 g/dL (ref 13.0–17.7)
MCH: 28.1 pg (ref 26.6–33.0)
MCHC: 32.6 g/dL (ref 31.5–35.7)
MCV: 86 fL (ref 79–97)
Platelets: 302 10*3/uL (ref 150–450)
RBC: 5.38 x10E6/uL (ref 4.14–5.80)
RDW: 12.4 % (ref 11.6–15.4)
WBC: 9.3 10*3/uL (ref 3.4–10.8)

## 2021-08-16 LAB — LIPID PANEL
Chol/HDL Ratio: 2.7 ratio (ref 0.0–5.0)
Cholesterol, Total: 134 mg/dL (ref 100–199)
HDL: 49 mg/dL (ref >39)
LDL Chol Calc (NIH): 60 mg/dL (ref 0–99)
Triglycerides: 148 mg/dL (ref 0–149)
VLDL Cholesterol Cal: 25 mg/dL (ref 5–40)

## 2021-08-18 ENCOUNTER — Other Ambulatory Visit: Payer: Self-pay

## 2021-09-01 ENCOUNTER — Other Ambulatory Visit: Payer: Self-pay

## 2021-09-01 ENCOUNTER — Other Ambulatory Visit: Payer: Self-pay | Admitting: Nurse Practitioner

## 2021-09-01 DIAGNOSIS — E785 Hyperlipidemia, unspecified: Secondary | ICD-10-CM

## 2021-09-01 MED ORDER — ROSUVASTATIN CALCIUM 5 MG PO TABS
5.0000 mg | ORAL_TABLET | Freq: Every day | ORAL | 2 refills | Status: DC
  Filled 2021-09-01: qty 30, 30d supply, fill #0
  Filled 2021-10-03: qty 30, 30d supply, fill #1
  Filled 2021-11-07: qty 30, 30d supply, fill #2

## 2021-09-16 ENCOUNTER — Other Ambulatory Visit: Payer: Self-pay

## 2021-09-23 ENCOUNTER — Other Ambulatory Visit: Payer: Self-pay

## 2021-10-03 ENCOUNTER — Other Ambulatory Visit: Payer: Self-pay

## 2021-10-03 ENCOUNTER — Other Ambulatory Visit: Payer: Self-pay | Admitting: Nurse Practitioner

## 2021-10-03 MED ORDER — TRAZODONE HCL 100 MG PO TABS
100.0000 mg | ORAL_TABLET | Freq: Every day | ORAL | 2 refills | Status: DC
  Filled 2021-10-03: qty 30, 30d supply, fill #0
  Filled 2021-11-26: qty 30, 30d supply, fill #1
  Filled 2022-02-03: qty 30, 30d supply, fill #2

## 2021-10-03 NOTE — Telephone Encounter (Signed)
Requested Prescriptions  Pending Prescriptions Disp Refills  . traZODone (DESYREL) 100 MG tablet 30 tablet 2    Sig: Take 1 tablet (100 mg total) by mouth at bedtime.     Psychiatry: Antidepressants - Serotonin Modulator Passed - 10/03/2021  4:13 PM      Passed - Valid encounter within last 6 months    Recent Outpatient Visits          1 month ago Controlled type 2 diabetes mellitus with complication, without long-term current use of insulin [facility])   [place] [facility] [place], [place] W, NP   4 months ago Controlled type 2 diabetes mellitus with complication, without long-term current use of insulin [facility])   [place] [facility] [place], [place] W, NP   5 months ago Controlled type 2 diabetes mellitus with complication, without long-term current use of insulin [facility])   [facility] [place], [place]ann[place] Senior L, RPH-CPP   7 months ago Controlled type 2 diabetes mellitus with complication, without long-term current use of insulin [facility])   [place] [facility] [place], Jeannett Senior L, RPH-CPP   8 months ago Controlled type 2 diabetes mellitus with complication, without long-term current use of insulin [facility])   [facility] Lois Huxley, Cornelius Moras, RPH-CPP      Future Appointments            In 1 month Claiborne Rigg, NP [facility]

## 2021-10-06 ENCOUNTER — Other Ambulatory Visit: Payer: Self-pay

## 2021-10-06 ENCOUNTER — Other Ambulatory Visit: Payer: Self-pay | Admitting: Pharmacist

## 2021-10-06 MED ORDER — SEMAGLUTIDE (1 MG/DOSE) 4 MG/3ML ~~LOC~~ SOPN
1.0000 mg | PEN_INJECTOR | SUBCUTANEOUS | 1 refills | Status: DC
  Filled 2021-10-06: qty 3, 28d supply, fill #0

## 2021-10-07 ENCOUNTER — Other Ambulatory Visit: Payer: Self-pay

## 2021-10-10 ENCOUNTER — Other Ambulatory Visit: Payer: Self-pay

## 2021-10-10 ENCOUNTER — Other Ambulatory Visit: Payer: Self-pay | Admitting: Pharmacist

## 2021-10-10 MED ORDER — BASAGLAR KWIKPEN 100 UNIT/ML ~~LOC~~ SOPN
10.0000 [IU] | PEN_INJECTOR | Freq: Every day | SUBCUTANEOUS | 2 refills | Status: DC
  Filled 2021-10-10 – 2022-02-06 (×2): qty 3, 28d supply, fill #0

## 2021-10-10 MED ORDER — TRULICITY 1.5 MG/0.5ML ~~LOC~~ SOAJ
1.5000 mg | SUBCUTANEOUS | 2 refills | Status: DC
  Filled 2021-10-10 – 2021-10-20 (×2): qty 2, 28d supply, fill #0
  Filled 2021-11-03: qty 2, 28d supply, fill #1
  Filled 2021-12-19: qty 2, 28d supply, fill #2

## 2021-10-16 ENCOUNTER — Other Ambulatory Visit: Payer: Self-pay

## 2021-10-17 ENCOUNTER — Other Ambulatory Visit: Payer: Self-pay

## 2021-10-20 ENCOUNTER — Other Ambulatory Visit: Payer: Self-pay

## 2021-11-03 ENCOUNTER — Other Ambulatory Visit: Payer: Self-pay

## 2021-11-04 ENCOUNTER — Other Ambulatory Visit: Payer: Self-pay

## 2021-11-07 ENCOUNTER — Other Ambulatory Visit: Payer: Self-pay

## 2021-11-21 ENCOUNTER — Ambulatory Visit: Payer: Self-pay | Admitting: Nurse Practitioner

## 2021-11-26 ENCOUNTER — Other Ambulatory Visit: Payer: Self-pay | Admitting: Nurse Practitioner

## 2021-11-26 ENCOUNTER — Other Ambulatory Visit: Payer: Self-pay | Admitting: Family Medicine

## 2021-11-26 ENCOUNTER — Other Ambulatory Visit: Payer: Self-pay

## 2021-11-26 DIAGNOSIS — E785 Hyperlipidemia, unspecified: Secondary | ICD-10-CM

## 2021-11-26 DIAGNOSIS — G8929 Other chronic pain: Secondary | ICD-10-CM

## 2021-11-26 MED ORDER — MELOXICAM 7.5 MG PO TABS
7.5000 mg | ORAL_TABLET | Freq: Every day | ORAL | 2 refills | Status: DC
  Filled 2021-11-26: qty 30, 30d supply, fill #0
  Filled 2022-01-19: qty 30, 30d supply, fill #1
  Filled 2022-02-24: qty 30, 30d supply, fill #2

## 2021-11-26 MED ORDER — ROSUVASTATIN CALCIUM 5 MG PO TABS
5.0000 mg | ORAL_TABLET | Freq: Every day | ORAL | 2 refills | Status: DC
  Filled 2021-11-26 – 2021-12-11 (×2): qty 30, 30d supply, fill #0
  Filled 2022-01-06: qty 30, 30d supply, fill #1
  Filled 2022-02-06: qty 30, 30d supply, fill #2

## 2021-12-11 ENCOUNTER — Other Ambulatory Visit: Payer: Self-pay

## 2021-12-19 ENCOUNTER — Other Ambulatory Visit: Payer: Self-pay

## 2021-12-30 ENCOUNTER — Other Ambulatory Visit: Payer: Self-pay

## 2022-01-06 ENCOUNTER — Other Ambulatory Visit: Payer: Self-pay

## 2022-01-07 ENCOUNTER — Other Ambulatory Visit: Payer: Self-pay

## 2022-01-12 ENCOUNTER — Ambulatory Visit: Payer: Self-pay | Admitting: Family Medicine

## 2022-01-19 ENCOUNTER — Other Ambulatory Visit: Payer: Self-pay

## 2022-01-19 ENCOUNTER — Other Ambulatory Visit: Payer: Self-pay | Admitting: Family Medicine

## 2022-01-20 ENCOUNTER — Other Ambulatory Visit: Payer: Self-pay

## 2022-01-20 MED ORDER — TRULICITY 1.5 MG/0.5ML ~~LOC~~ SOAJ
1.5000 mg | SUBCUTANEOUS | 2 refills | Status: DC
  Filled 2022-01-20: qty 2, 28d supply, fill #0
  Filled 2022-02-11 (×2): qty 2, 28d supply, fill #1

## 2022-02-03 ENCOUNTER — Other Ambulatory Visit: Payer: Self-pay

## 2022-02-06 ENCOUNTER — Ambulatory Visit: Payer: Self-pay

## 2022-02-06 ENCOUNTER — Other Ambulatory Visit (HOSPITAL_COMMUNITY): Payer: Self-pay

## 2022-02-06 ENCOUNTER — Other Ambulatory Visit: Payer: Self-pay

## 2022-02-06 NOTE — Telephone Encounter (Signed)
Pt in ED overnight in ED at [facility] EDBS over 400- pt not taking his Basaglar per Jae Dire NP at ED. Unsure if pt picked up original prescription written in September. NP stated pt stated he is not aware of any insulin. Pt has f/u- needs education of his diabetes and clear instructions. Pt has canceled many appts with Pharmacist.    Chief Complaint: pt unaware that he is to b on Basaglar. Symptoms: BS >400. Frequency: In ED Pertinent Negatives: Patient denies in ED- stated he was not aware of taking insulin Disposition: [] ED /[] Urgent Care (no appt availability in office) / [] Appointment(In office/virtual)/ []  [place] [facility]/ [] Home Care/ [] Refused Recommended Disposition /[] [place] [facility]/ [x]  Follow-up with PCP Additional Notes: routed to [facility] to see if was ever picked up- needs education  Reason for Disposition  [1] Caller has URGENT medicine question about med that PCP or specialist prescribed AND [2] triager unable to answer question  Answer Assessment - Initial Assessment Questions 1. NAME of MEDICINE: "What medicine(s) are you calling about?"     basaglar 2. QUESTION: "What is your question?" (e.g., double dose of medicine, side effect)     Is pt on Basaglar 3. PRESCRIBER: "Who prescribed the medicine?" Reason: if prescribed by specialist, call should be referred to that group.     PCP 4. SYMPTOMS: "Do you have any symptoms?" If Yes, ask: "What symptoms are you having?"  "How bad are the symptoms (e.g., mild, moderate, severe)     In ED at [facility] with BS > 400 5. PREGNANCY:  "Is there any chance that you are pregnant?" "When was your last menstrual period?"     N/a  Protocols used: Medication Question Call-A-AH

## 2022-02-11 ENCOUNTER — Other Ambulatory Visit: Payer: Self-pay

## 2022-02-11 ENCOUNTER — Telehealth: Payer: Self-pay | Admitting: Nurse Practitioner

## 2022-02-11 NOTE — Telephone Encounter (Signed)
Copied CRM Source  Lance Howell (Patient)   Subject  Lance Howell (Patient)   Topic  Appointment Scheduling - Scheduling Inquiry for Clinic     Summary  Needs sooner appt  Communication  Reason for CRM: Pt's wife called requesting for the patient to be seen sooner than when he has been rescheduled for. Pt's wife says he cannot wait any longer and has blood pressure and sugar readings to share with the provider. Please advise        Tried calling the flow co.               Pt's wife has called and states that she was given an appt with Dr Joya Gaskins for husband today and stated was called and told her was tomorrow but did not give a time. Checked Dr Ileana Roup appts along with appt sch for tomorrow and did not see an appt.  Also check daily sch for tomorrow for them,  I do see an open enounter with Pharmacy and offered to transfer to pharmacy but caller was very vocal with interpreter that appt was tomorrow and not with a nurse. Does not want to talk to nurse, only wants appt time...  I told pt I would send a message back to office as was closed. She was upset and said to forget it  would keep appt and would not give a cb # and hung up. Sending message anyway as told her that I would FU with office. Were Calling from [phone]

## 2022-02-12 ENCOUNTER — Ambulatory Visit: Payer: Self-pay | Admitting: Physician Assistant

## 2022-02-16 ENCOUNTER — Encounter: Payer: Self-pay | Admitting: Critical Care Medicine

## 2022-02-16 NOTE — Progress Notes (Signed)
Established Patient Office Visit  Subjective   Patient ID: Lance Howell, male    DOB: 1970-05-28  Age: 52 y.o. MRN: [id number]  Chief Complaint  Patient presents with   Hospitalization Follow-up   Medication Refill    51 y.o.M PCP Meredeth Ide  Patient seen today for posthospital follow-up.  He was seen in the emergency room in [facility] December 29 with elevated blood pressure 138/100 and blood sugar of 450.  We tried to get him into the clinic last week but no appointment could be achieved he is here today for follow-up.  At the ER visit they prescribed insulin Basaglar 10 units daily his blood sugar meter is brought in today are reviewed numbers he is gradually come down to the 400 range down to between 2 and 300 and more recently the last couple days down to 150.  He is not on any short acting insulin.  Today blood pressure on arrival is 136/94.  Note this visit was assisted by Spanish interpreter Gunnar Fusi [phone]  Patient states he eats a processed sugar shake in the morning salad with grilled chicken and fish at lunch and he eats leftovers of the salad for dinner.  He works as a Clinical research associate he does try to drink water during the day.  The patient does not smoke or drink alcohol.  He does complain of polyuria and eye blurring.  He denies any chest pain or shortness of breath or other symptoms.  Patient is on lisinopril alone for blood pressure control.  Patient also take Trulicity weekly.  He is on low-dose rosuvastatin for cholesterol.  And is on the metformin twice daily at 1000 mg.  He needs refills on all medications.      Review of Systems  Constitutional:  Negative for chills, diaphoresis, fever, malaise/fatigue and weight loss.  HENT:  Negative for congestion, hearing loss, nosebleeds, sore throat and tinnitus.   Eyes:  Positive for blurred vision. Negative for photophobia and redness.       Eye moves left  Respiratory:  Negative for cough, hemoptysis, sputum production,  shortness of breath, wheezing and stridor.   Cardiovascular:  Negative for chest pain, palpitations, orthopnea, claudication, leg swelling and PND.  Gastrointestinal:  Negative for abdominal pain, blood in stool, constipation, diarrhea, heartburn, melena, nausea and vomiting.  Genitourinary:  Negative for dysuria, flank pain, frequency, hematuria and urgency.       Polyruria  Musculoskeletal:  Negative for back pain, falls, joint pain, myalgias and neck pain.  Skin:  Negative for itching and rash.  Neurological:  Positive for headaches. Negative for dizziness, tingling, tremors, sensory change, speech change, focal weakness, seizures, loss of consciousness and weakness.  Endo/Heme/Allergies:  Negative for environmental allergies and polydipsia. Does not bruise/bleed easily.  Psychiatric/Behavioral:  Negative for depression, memory loss, substance abuse and suicidal ideas. The patient is not nervous/anxious and does not have insomnia.       Objective:     BP (!) 136/94   Pulse 77   Ht 5' 10.5" (1.791 m)   Wt 207 lb 3.2 oz (94 kg)   SpO2 99%   BMI 29.31 kg/m    Physical Exam Vitals reviewed.  Constitutional:      Appearance: Normal appearance. He is well-developed. He is obese. He is not diaphoretic.  HENT:     Head: Normocephalic and atraumatic.     Nose: No nasal deformity, septal deviation, mucosal edema or rhinorrhea.     Right Sinus: No maxillary  sinus tenderness or frontal sinus tenderness.     Left Sinus: No maxillary sinus tenderness or frontal sinus tenderness.     Mouth/Throat:     Pharynx: No oropharyngeal exudate.  Eyes:     General: No scleral icterus.    Conjunctiva/sclera: Conjunctivae normal.     Pupils: Pupils are equal, round, and reactive to light.  Neck:     Thyroid: No thyromegaly.     Vascular: No carotid bruit or JVD.     Trachea: Trachea normal. No tracheal tenderness or tracheal deviation.  Cardiovascular:     Rate and Rhythm: Normal rate and  regular rhythm.     Chest Wall: PMI is not displaced.     Pulses: Normal pulses. No decreased pulses.     Heart sounds: Normal heart sounds, S1 normal and S2 normal. Heart sounds not distant. No murmur heard.    No systolic murmur is present.     No diastolic murmur is present.     No friction rub. No gallop. No S3 or S4 sounds.  Pulmonary:     Effort: No tachypnea, accessory muscle usage or respiratory distress.     Breath sounds: No stridor. No decreased breath sounds, wheezing, rhonchi or rales.  Chest:     Chest wall: No tenderness.  Abdominal:     General: Bowel sounds are normal. There is no distension.     Palpations: Abdomen is soft. Abdomen is not rigid.     Tenderness: There is no abdominal tenderness. There is no guarding or rebound.  Musculoskeletal:        General: Normal range of motion.     Cervical back: Normal range of motion and neck supple. No edema, erythema or rigidity. No muscular tenderness. Normal range of motion.  Lymphadenopathy:     Head:     Right side of head: No submental or submandibular adenopathy.     Left side of head: No submental or submandibular adenopathy.     Cervical: No cervical adenopathy.  Skin:    General: Skin is warm and dry.     Coloration: Skin is not pale.     Findings: No rash.     Nails: There is no clubbing.  Neurological:     Mental Status: He is alert and oriented to person, place, and time.     Sensory: No sensory deficit.  Psychiatric:        Speech: Speech normal.        Behavior: Behavior normal.      No results found for any visits on 02/17/22.    The 10-year ASCVD risk score (Arnett DK, et al., 2019) is: 7.9%    Assessment & Plan:   Problem List Items Addressed This Visit       Cardiovascular and Mediastinum   Primary hypertension    Hypertension poorly controlled we will discontinue lisinopril and begin valsartan HCT 320/25 daily he will i begin amlodipine at 5 mg daily  Patient given lifestyle  medicine handout in Spanish The following Lifestyle Medicine recommendations according to American College of Lifestyle Medicine [facility]) were discussed and offered to patient who agrees to start the journey:  A. Whole Foods, Plant-based plate comprising of fruits and vegetables, plant-based proteins, whole-grain carbohydrates was discussed in detail with the patient.   A list for source of those nutrients were also provided to the patient.  Patient will use only water or unsweetened tea for hydration. B.  The need to stay away from risky  substances including alcohol, smoking; obtaining 7 to 9 hours of restorative sleep, at least 150 minutes of moderate intensity exercise weekly, the importance of healthy social connections,  and stress reduction techniques were discussed. C.  A full color page of  Calorie density of various food groups per pound showing examples of each food groups was provided to the patient.       Relevant Medications   rosuvastatin (CRESTOR) 5 MG tablet   valsartan-hydrochlorothiazide (DIOVAN-HCT) 320-25 MG tablet   amLODipine (NORVASC) 5 MG tablet     Endocrine   Uncontrolled type 2 diabetes mellitus with hyperglycemia, with long-term current use of insulin (HCC) - Primary    Uncontrolled type 2 diabetes now on insulin with hyperglycemia.  He will need to have increased dose of the insulin and will increase insulin Basaglar to 20 units daily and begin insulin lispro at 5 units twice daily with 2 of the largest meals of the day and he will hold for blood sugars less than 150 Continue Trulicity weekly at 1.5 mg and then also continue metformin  Obtain metabolic profile at this visit  Return for short-term follow-up with the patient's primary care provider Ms. Meredeth Ide which is scheduled for January 26  The following Lifestyle Medicine recommendations according to [facility] of Lifestyle Medicine [facility]) were discussed and offered to patient who agrees to start the journey:   A. Whole Foods, Plant-based plate comprising of fruits and vegetables, plant-based proteins, whole-grain carbohydrates was discussed in detail with the patient.   A list for source of those nutrients were also provided to the patient.  Patient will use only water or unsweetened tea for hydration. B.  The need to stay away from risky substances including alcohol, smoking; obtaining 7 to 9 hours of restorative sleep, at least 150 minutes of moderate intensity exercise weekly, the importance of healthy social connections,  and stress reduction techniques were discussed. C.  A full color page of  Calorie density of various food groups per pound showing examples of each food groups was provided to the patient.       Relevant Medications   Dulaglutide (TRULICITY) 1.5 MG/0.5ML SOPN   Insulin Glargine (BASAGLAR KWIKPEN) 100 UNIT/ML   metFORMIN (GLUCOPHAGE) 1000 MG tablet   rosuvastatin (CRESTOR) 5 MG tablet   valsartan-hydrochlorothiazide (DIOVAN-HCT) 320-25 MG tablet   insulin lispro (HUMALOG) 100 UNIT/ML KwikPen     Other   Dyslipidemia, goal LDL below 70    Assess lipid panel and continue Crestor      Relevant Medications   rosuvastatin (CRESTOR) 5 MG tablet   valsartan-hydrochlorothiazide (DIOVAN-HCT) 320-25 MG tablet   amLODipine (NORVASC) 5 MG tablet   Other Relevant Orders   Lipid panel   Other Visit Diagnoses     Periodic headache syndrome, not intractable       Relevant Medications   butalbital-acetaminophen-caffeine (FIORICET) 50-325-40 MG tablet   rosuvastatin (CRESTOR) 5 MG tablet   traZODone (DESYREL) 100 MG tablet   valsartan-hydrochlorothiazide (DIOVAN-HCT) 320-25 MG tablet   amLODipine (NORVASC) 5 MG tablet      45 minutes spent extra time needed educating patient and language barrier Return for diabetes, htn.  January 26 with primary care provider   Shan Levans, MD

## 2022-02-17 ENCOUNTER — Encounter: Payer: Self-pay | Admitting: Critical Care Medicine

## 2022-02-17 ENCOUNTER — Ambulatory Visit: Payer: BC Managed Care – PPO | Attending: Nurse Practitioner | Admitting: Critical Care Medicine

## 2022-02-17 ENCOUNTER — Telehealth: Payer: Self-pay

## 2022-02-17 ENCOUNTER — Other Ambulatory Visit: Payer: Self-pay

## 2022-02-17 VITALS — BP 136/94 | HR 77 | Ht 70.5 in | Wt 207.2 lb

## 2022-02-17 DIAGNOSIS — E118 Type 2 diabetes mellitus with unspecified complications: Secondary | ICD-10-CM

## 2022-02-17 DIAGNOSIS — G43C0 Periodic headache syndromes in child or adult, not intractable: Secondary | ICD-10-CM | POA: Diagnosis not present

## 2022-02-17 DIAGNOSIS — E1165 Type 2 diabetes mellitus with hyperglycemia: Secondary | ICD-10-CM

## 2022-02-17 DIAGNOSIS — Z794 Long term (current) use of insulin: Secondary | ICD-10-CM

## 2022-02-17 DIAGNOSIS — I10 Essential (primary) hypertension: Secondary | ICD-10-CM

## 2022-02-17 DIAGNOSIS — E785 Hyperlipidemia, unspecified: Secondary | ICD-10-CM

## 2022-02-17 LAB — GLUCOSE, POCT (MANUAL RESULT ENTRY): POC Glucose: 190 mg/dl — AB (ref 70–99)

## 2022-02-17 MED ORDER — BUTALBITAL-APAP-CAFFEINE 50-325-40 MG PO TABS
1.0000 | ORAL_TABLET | Freq: Four times a day (QID) | ORAL | 1 refills | Status: DC | PRN
  Filled 2022-02-17: qty 30, 4d supply, fill #0
  Filled 2022-03-24: qty 30, 4d supply, fill #1

## 2022-02-17 MED ORDER — TRAZODONE HCL 100 MG PO TABS
100.0000 mg | ORAL_TABLET | Freq: Every day | ORAL | 2 refills | Status: DC
  Filled 2022-02-17 – 2022-03-24 (×2): qty 30, 30d supply, fill #0
  Filled 2022-05-13: qty 30, 30d supply, fill #1
  Filled 2022-06-15 (×2): qty 30, 30d supply, fill #2

## 2022-02-17 MED ORDER — INSULIN LISPRO (1 UNIT DIAL) 100 UNIT/ML (KWIKPEN)
PEN_INJECTOR | SUBCUTANEOUS | 1 refills | Status: DC
  Filled 2022-02-17: qty 15, 150d supply, fill #0
  Filled 2022-04-16: qty 15, 150d supply, fill #1

## 2022-02-17 MED ORDER — VALSARTAN-HYDROCHLOROTHIAZIDE 320-25 MG PO TABS
1.0000 | ORAL_TABLET | Freq: Every day | ORAL | 3 refills | Status: DC
  Filled 2022-02-17: qty 90, 90d supply, fill #0
  Filled 2022-05-19: qty 90, 90d supply, fill #1
  Filled 2022-08-13 – 2022-08-14 (×2): qty 90, 90d supply, fill #2

## 2022-02-17 MED ORDER — TRULICITY 1.5 MG/0.5ML ~~LOC~~ SOAJ
1.5000 mg | SUBCUTANEOUS | 2 refills | Status: DC
  Filled 2022-02-17 – 2022-04-06 (×4): qty 2, 28d supply, fill #0
  Filled 2022-05-13: qty 2, 28d supply, fill #1

## 2022-02-17 MED ORDER — AMLODIPINE BESYLATE 5 MG PO TABS
5.0000 mg | ORAL_TABLET | Freq: Every day | ORAL | 2 refills | Status: DC
  Filled 2022-02-17: qty 30, 30d supply, fill #0
  Filled 2022-02-17: qty 90, 90d supply, fill #0
  Filled 2022-03-16 – 2022-03-20 (×3): qty 30, 30d supply, fill #1
  Filled 2022-04-16: qty 30, 30d supply, fill #2
  Filled 2022-05-19: qty 30, 30d supply, fill #3
  Filled 2022-06-15 (×2): qty 30, 30d supply, fill #4
  Filled 2022-07-13: qty 30, 30d supply, fill #5
  Filled 2022-08-13 – 2022-08-14 (×2): qty 30, 30d supply, fill #6

## 2022-02-17 MED ORDER — BASAGLAR KWIKPEN 100 UNIT/ML ~~LOC~~ SOPN
20.0000 [IU] | PEN_INJECTOR | Freq: Every day | SUBCUTANEOUS | 2 refills | Status: DC
  Filled 2022-02-17: qty 9, 45d supply, fill #0
  Filled 2022-03-11 – 2022-03-16 (×2): qty 3, 15d supply, fill #0
  Filled 2022-03-16: qty 6, 30d supply, fill #0

## 2022-02-17 MED ORDER — ROSUVASTATIN CALCIUM 5 MG PO TABS
5.0000 mg | ORAL_TABLET | Freq: Every day | ORAL | 2 refills | Status: DC
  Filled 2022-02-17 – 2022-04-09 (×4): qty 30, 30d supply, fill #0
  Filled 2022-05-13: qty 30, 30d supply, fill #1

## 2022-02-17 MED ORDER — METFORMIN HCL 1000 MG PO TABS
1000.0000 mg | ORAL_TABLET | Freq: Two times a day (BID) | ORAL | 1 refills | Status: DC
  Filled 2022-02-17: qty 360, 180d supply, fill #0
  Filled 2022-03-06 – 2022-04-09 (×3): qty 60, 30d supply, fill #0
  Filled 2022-05-13: qty 60, 30d supply, fill #1
  Filled 2022-06-15 (×2): qty 60, 30d supply, fill #2
  Filled 2022-07-13: qty 60, 30d supply, fill #3
  Filled 2022-08-13 – 2022-08-14 (×2): qty 60, 30d supply, fill #4
  Filled 2022-09-15 (×2): qty 60, 30d supply, fill #5
  Filled 2022-10-14: qty 60, 30d supply, fill #6
  Filled 2022-11-10: qty 60, 30d supply, fill #7

## 2022-02-17 NOTE — Assessment & Plan Note (Signed)
Hypertension poorly controlled we will discontinue lisinopril and begin valsartan HCT 320/25 daily he will i begin amlodipine at 5 mg daily  Patient given lifestyle medicine handout in Spanish The following Lifestyle Medicine recommendations according to [facility]) were discussed and offered to patient who agrees to start the journey:  A. Whole Foods, Plant-based plate comprising of fruits and vegetables, plant-based proteins, whole-grain carbohydrates was discussed in detail with the patient.   A list for source of those nutrients were also provided to the patient.  Patient will use only water or unsweetened tea for hydration. B.  The need to stay away from risky substances including alcohol, smoking; obtaining 7 to 9 hours of restorative sleep, at least 150 minutes of moderate intensity exercise weekly, the importance of healthy social connections,  and stress reduction techniques were discussed. C.  A full color page of  Calorie density of various food groups per pound showing examples of each food groups was provided to the patient.

## 2022-02-17 NOTE — Telephone Encounter (Signed)
Letter issued

## 2022-02-17 NOTE — Patient Instructions (Addendum)
Increase insulin Basaglar to 20 units daily  Begin insulin lispro short acting 5 units before 2 of the largest meals of the day, check your blood sugar if less than 150 do not give the medication  Stay on Trulicity once weekly  Stay on metformin as you have been taking it  Stop lisinopril and begin valsartan HCT 1 daily and amlodipine 1 pill daily for blood pressure  Labs today will include kidney panel and cholesterol  Keep your January 26 appointment with Ms. Fleming for follow-up  Follow diet and exercise portion of the lifestyle medicine handout you were given  Aumentar la insulina Basaglar a 20 unidades diarias.  Comience con insulina lispro de accin corta 5 unidades antes de 2 de las comidas ms importantes del da, controle su nivel de azcar en la sangre si es inferior a 150, no le d Dentist.  Mantngase en Trulicity una vez por semana  Contine tomando metformina como la ha [place].  Suspenda lisinopril y comience con valsartn HCT 1 comprimido al da y amlodipino 1 comprimido al da para la presin arterial.  Los laboratorios de hoy incluirn panel renal y Research officer, trade union.  Mantenga su cita del 26 de enero con la Sra. Raul Del para seguimiento  Siga la parte de dieta y ejercicio del folleto de Chemical engineer de estilo de vida que Environmental education officer.

## 2022-02-17 NOTE — Assessment & Plan Note (Signed)
Assess lipid panel and continue Crestor

## 2022-02-17 NOTE — Telephone Encounter (Signed)
Patient was uncertain out if he can have his insulin at work  Can a letter be produced so he is able to check and take insulin if needed

## 2022-02-17 NOTE — Assessment & Plan Note (Addendum)
Uncontrolled type 2 diabetes now on insulin with hyperglycemia.  He will need to have increased dose of the insulin and will increase insulin Basaglar to 20 units daily and begin insulin lispro at 5 units twice daily with 2 of the largest meals of the day and he will hold for blood sugars less than 102 Continue Trulicity weekly at 1.5 mg and then also continue metformin  Obtain metabolic profile at this visit  Return for short-term follow-up with the patient's primary care provider Ms. Raul Del which is scheduled for January 26  The following Lifestyle Medicine recommendations according to [facility] of Lifestyle Medicine [facility]) were discussed and offered to patient who agrees to start the journey:  A. Whole Foods, Plant-based plate comprising of fruits and vegetables, plant-based proteins, whole-grain carbohydrates was discussed in detail with the patient.   A list for source of those nutrients were also provided to the patient.  Patient will use only water or unsweetened tea for hydration. B.  The need to stay away from risky substances including alcohol, smoking; obtaining 7 to 9 hours of restorative sleep, at least 150 minutes of moderate intensity exercise weekly, the importance of healthy social connections,  and stress reduction techniques were discussed. C.  A full color page of  Calorie density of various food groups per pound showing examples of each food groups was provided to the patient.

## 2022-02-18 LAB — BMP8+EGFR
BUN/Creatinine Ratio: 17 (ref 9–20)
BUN: 13 mg/dL (ref 6–24)
CO2: 24 mmol/L (ref 20–29)
Calcium: 9.8 mg/dL (ref 8.7–10.2)
Chloride: 100 mmol/L (ref 96–106)
Creatinine, Ser: 0.78 mg/dL (ref 0.76–1.27)
Glucose: 190 mg/dL — ABNORMAL HIGH (ref 70–99)
Potassium: 4.7 mmol/L (ref 3.5–5.2)
Sodium: 137 mmol/L (ref 134–144)
eGFR: 109 mL/min/{1.73_m2} (ref >59)

## 2022-02-18 LAB — LIPID PANEL
Chol/HDL Ratio: 2.4 ratio (ref 0.0–5.0)
Cholesterol, Total: 120 mg/dL (ref 100–199)
HDL: 50 mg/dL (ref >39)
LDL Chol Calc (NIH): 52 mg/dL (ref 0–99)
Triglycerides: 93 mg/dL (ref 0–149)
VLDL Cholesterol Cal: 18 mg/dL (ref 5–40)

## 2022-02-18 NOTE — Telephone Encounter (Signed)
Letter mailed

## 2022-02-18 NOTE — Progress Notes (Signed)
Let pt know cholesterol at goal no change in cholesterol meds,  kidney normal

## 2022-02-20 ENCOUNTER — Telehealth: Payer: Self-pay

## 2022-02-20 NOTE — Telephone Encounter (Signed)
-----  Message from Elsie Stain, MD sent at 02/18/2022  5:55 AM EST ----- Let pt know cholesterol at goal no change in cholesterol meds,  kidney normal

## 2022-02-20 NOTE — Telephone Encounter (Signed)
Pt was called and vm was left, Information has been sent to nurse pool.   Interpreter id#[id number] 

## 2022-02-24 ENCOUNTER — Other Ambulatory Visit: Payer: Self-pay

## 2022-03-06 ENCOUNTER — Other Ambulatory Visit (HOSPITAL_COMMUNITY): Payer: Self-pay

## 2022-03-06 ENCOUNTER — Other Ambulatory Visit: Payer: Self-pay

## 2022-03-06 ENCOUNTER — Ambulatory Visit: Payer: Self-pay | Admitting: Nurse Practitioner

## 2022-03-10 ENCOUNTER — Other Ambulatory Visit (HOSPITAL_COMMUNITY): Payer: Self-pay

## 2022-03-10 ENCOUNTER — Other Ambulatory Visit: Payer: Self-pay

## 2022-03-11 ENCOUNTER — Other Ambulatory Visit: Payer: Self-pay

## 2022-03-13 ENCOUNTER — Other Ambulatory Visit: Payer: Self-pay

## 2022-03-16 ENCOUNTER — Other Ambulatory Visit: Payer: Self-pay | Admitting: Critical Care Medicine

## 2022-03-16 ENCOUNTER — Other Ambulatory Visit: Payer: Self-pay | Admitting: Pharmacist

## 2022-03-16 ENCOUNTER — Other Ambulatory Visit: Payer: Self-pay

## 2022-03-16 MED ORDER — BASAGLAR KWIKPEN 100 UNIT/ML ~~LOC~~ SOPN: 20.0000 [IU] | PEN_INJECTOR | Freq: Every day | SUBCUTANEOUS | 2 refills | Status: DC

## 2022-03-20 ENCOUNTER — Other Ambulatory Visit: Payer: Self-pay

## 2022-03-20 ENCOUNTER — Encounter: Payer: Self-pay | Admitting: Nurse Practitioner

## 2022-03-20 ENCOUNTER — Ambulatory Visit: Payer: BC Managed Care – PPO | Attending: Nurse Practitioner | Admitting: Nurse Practitioner

## 2022-03-20 VITALS — BP 107/66 | HR 75 | Ht 70.9 in | Wt 203.6 lb

## 2022-03-20 DIAGNOSIS — I10 Essential (primary) hypertension: Secondary | ICD-10-CM | POA: Diagnosis not present

## 2022-03-20 DIAGNOSIS — E785 Hyperlipidemia, unspecified: Secondary | ICD-10-CM

## 2022-03-20 DIAGNOSIS — Z1211 Encounter for screening for malignant neoplasm of colon: Secondary | ICD-10-CM | POA: Diagnosis not present

## 2022-03-20 DIAGNOSIS — E118 Type 2 diabetes mellitus with unspecified complications: Secondary | ICD-10-CM

## 2022-03-20 DIAGNOSIS — E1169 Type 2 diabetes mellitus with other specified complication: Secondary | ICD-10-CM | POA: Diagnosis not present

## 2022-03-20 NOTE — Progress Notes (Signed)
Assessment & Plan:  Lance Howell was seen today for hypertension.  Diagnoses and all orders for this visit:  Controlled type 2 diabetes mellitus with complication, without long-term current use of insulin  Continue blood sugar control as discussed in office today, low carbohydrate diet, and regular physical exercise as tolerated, 150 minutes per week (30 min each day, 5 days per week, or 50 min 3 days per week). Keep blood sugar logs with fasting goal of 90-130 mg/dl, post prandial (after you eat) less than 180.  For Hypoglycemia: BS <60 and Hyperglycemia BS >400; contact the clinic ASAP. Annual eye exams and foot exams are recommended.   Primary hypertension Continue all antihypertensives as prescribed.  Reminded to bring in blood pressure log for follow  up appointment.  RECOMMENDATIONS: DASH/Mediterranean Diets are healthier choices for HTN.    Colon cancer screening -     Fecal occult blood, imunochemical  Hyperlipidemia associated with type 2 diabetes mellitus  INSTRUCTIONS: Work on a low fat, heart healthy diet and participate in regular aerobic exercise program by working out at least 150 minutes per week; 5 days a week-30 minutes per day. Avoid red meat/beef/steak,  fried foods. junk foods, sodas, sugary drinks, unhealthy snacking, alcohol and smoking.  Drink at least 80 oz of water per day and monitor your carbohydrate intake daily.      Patient has been counseled on age-appropriate routine health concerns for screening and prevention. These are reviewed and up-to-date. Referrals have been placed accordingly. Immunizations are up-to-date or declined.    Subjective:   Chief Complaint  Patient presents with   Hypertension   HPI Lance Howell 52 y.o. male presents to office today for follow up to HTN.  He has a past medical history of DM2, Hyperlipidemia, and Hypertension.   HTN Blood pressure is well controlled today. He is taking diovan-hct 320-25 mg daily.  He reports  high readings at home however he is also checking his blood pressures as soon as he gets off work which is third shift. I have instructed him to take his blood pressure after he has had at least 6-8 hours of sleep after work.  BP Readings from Last 3 Encounters:  03/20/22 107/66  02/17/22 (!) 136/94  08/15/21 (!) 140/98    DM 2 Poorly controlled. A1c has increased from 8.7 to 11.8. He is not administering humalog as prescribed. Taking Trulicity 1.5 mg weekly, Basaglar 20 units daily and metformin 1000 mg BID. I have instructed him to administer his insulin 5 units prior to 2 of his biggest meals.  Lab Results  Component Value Date   HGBA1C 8.7 (A) 08/15/2021  Cholesterol levels at goal with rosuvastatin 5 mg daily.  Lab Results  Component Value Date   LDLCALC 52 02/17/2022    Review of Systems  Constitutional:  Negative for fever, malaise/fatigue and weight loss.  HENT: Negative.  Negative for nosebleeds.   Eyes: Negative.  Negative for blurred vision, double vision and photophobia.  Respiratory: Negative.  Negative for cough and shortness of breath.   Cardiovascular: Negative.  Negative for chest pain, palpitations and leg swelling.  Gastrointestinal: Negative.  Negative for heartburn, nausea and vomiting.  Musculoskeletal: Negative.  Negative for myalgias.  Neurological: Negative.  Negative for dizziness, focal weakness, seizures and headaches.  Psychiatric/Behavioral: Negative.  Negative for suicidal ideas.     Past Medical History:  Diagnosis Date   Diabetes mellitus without complication (HCC)    Hyperlipidemia    Hypertension  Past Surgical History:  Procedure Laterality Date   APPENDECTOMY     kidney stone removal      Family History  Problem Relation Age of Onset   Hypertension Mother    Diabetes Father     Social History Reviewed with no changes to be made today.   Outpatient Medications Prior to Visit  Medication Sig Dispense Refill   amLODipine  (NORVASC) 5 MG tablet Take 1 tablet (5 mg total) by mouth daily. 90 tablet 2   Blood Glucose Monitoring Suppl (TRUE METRIX METER) w/Device KIT Use as instructed. 1 kit 0   butalbital-acetaminophen-caffeine (FIORICET) 50-325-40 MG tablet Take 1-2 tablets by mouth every 6 (six) hours as needed for headache. 30 tablet 1   Dulaglutide (TRULICITY) 1.5 0000000 SOPN Inject 1.5 mg into the skin once a week. 2 mL 2   glucose blood test strip Use as instructed. Check blood glucose level by fingerstick twice per day. 200 each 12   Insulin Glargine (BASAGLAR KWIKPEN) 100 UNIT/ML Inject 20 Units into the skin daily. 15 mL 2   insulin lispro (HUMALOG) 100 UNIT/ML KwikPen 5 units twice daily before largest meal of the day 15 mL 1   meloxicam (MOBIC) 7.5 MG tablet Take 1 tablet (7.5 mg total) by mouth daily. 30 tablet 2   metFORMIN (GLUCOPHAGE) 1000 MG tablet Take 1 tablet (1,000 mg total) by mouth 2 (two) times daily with a meal. 360 tablet 1   rosuvastatin (CRESTOR) 5 MG tablet Take 1 tablet (5 mg total) by mouth daily. 30 tablet 2   traZODone (DESYREL) 100 MG tablet Take 1 tablet (100 mg total) by mouth at bedtime. 30 tablet 2   TRUEplus Lancets 28G MISC Use as instructed. Check blood glucose level by fingerstick twice per day. 200 each 12   valsartan-hydrochlorothiazide (DIOVAN-HCT) 320-25 MG tablet Take 1 tablet by mouth daily. 90 tablet 3   No facility-administered medications prior to visit.    No Known Allergies     Objective:    BP 107/66   Pulse 75   Ht 5' 10.9" (1.801 m)   Wt 203 lb 9.6 oz (92.4 kg)   SpO2 96%   BMI 28.48 kg/m  Wt Readings from Last 3 Encounters:  03/20/22 203 lb 9.6 oz (92.4 kg)  02/17/22 207 lb 3.2 oz (94 kg)  08/15/21 202 lb (91.6 kg)    Physical Exam Vitals and nursing note reviewed.  Constitutional:      Appearance: He is well-developed.  HENT:     Head: Normocephalic and atraumatic.  Cardiovascular:     Rate and Rhythm: Normal rate and regular rhythm.      Heart sounds: Normal heart sounds. No murmur heard.    No friction rub. No gallop.  Pulmonary:     Effort: Pulmonary effort is normal. No tachypnea or respiratory distress.     Breath sounds: Normal breath sounds. No decreased breath sounds, wheezing, rhonchi or rales.  Chest:     Chest wall: No tenderness.  Abdominal:     General: Bowel sounds are normal.     Palpations: Abdomen is soft.  Musculoskeletal:        General: Normal range of motion.     Cervical back: Normal range of motion.  Skin:    General: Skin is warm and dry.  Neurological:     Mental Status: He is alert and oriented to person, place, and time.     Coordination: Coordination normal.  Psychiatric:  Behavior: Behavior normal. Behavior is cooperative.        Thought Content: Thought content normal.        Judgment: Judgment normal.          Patient has been counseled extensively about nutrition and exercise as well as the importance of adherence with medications and regular follow-up. The patient was given clear instructions to go to ER or return to medical center if symptoms don't improve, worsen or new problems develop. The patient verbalized understanding.   Follow-up: Return in 2 months (on 05/19/2022).   Gildardo Pounds, FNP-BC [facility] [place], [place]   03/20/2022, 3:10 PM

## 2022-03-24 ENCOUNTER — Other Ambulatory Visit: Payer: Self-pay

## 2022-03-27 ENCOUNTER — Other Ambulatory Visit: Payer: Self-pay

## 2022-03-30 ENCOUNTER — Other Ambulatory Visit: Payer: Self-pay | Admitting: Family Medicine

## 2022-03-30 ENCOUNTER — Other Ambulatory Visit: Payer: Self-pay | Admitting: Pharmacist

## 2022-03-30 ENCOUNTER — Other Ambulatory Visit: Payer: Self-pay

## 2022-03-30 MED ORDER — TRUEPLUS 5-BEVEL PEN NEEDLES 32G X 4 MM MISC
3 refills | Status: DC
  Filled 2022-03-30: qty 100, 33d supply, fill #0
  Filled 2022-05-22: qty 100, 33d supply, fill #1

## 2022-03-30 MED ORDER — BASAGLAR KWIKPEN 100 UNIT/ML ~~LOC~~ SOPN
20.0000 [IU] | PEN_INJECTOR | Freq: Every day | SUBCUTANEOUS | 2 refills | Status: DC
  Filled 2022-04-06: qty 6, 30d supply, fill #0
  Filled 2022-05-13: qty 6, 30d supply, fill #1
  Filled 2022-06-15 (×2): qty 6, 30d supply, fill #2

## 2022-04-01 LAB — FECAL OCCULT BLOOD, IMMUNOCHEMICAL: Fecal Occult Bld: NEGATIVE

## 2022-04-06 ENCOUNTER — Other Ambulatory Visit: Payer: Self-pay

## 2022-04-09 ENCOUNTER — Other Ambulatory Visit: Payer: Self-pay | Admitting: Family Medicine

## 2022-04-09 ENCOUNTER — Other Ambulatory Visit: Payer: Self-pay

## 2022-04-09 DIAGNOSIS — G8929 Other chronic pain: Secondary | ICD-10-CM

## 2022-04-10 ENCOUNTER — Other Ambulatory Visit: Payer: Self-pay

## 2022-04-10 MED ORDER — MELOXICAM 7.5 MG PO TABS
7.5000 mg | ORAL_TABLET | Freq: Every day | ORAL | 0 refills | Status: DC
  Filled 2022-04-10: qty 90, 90d supply, fill #0

## 2022-04-10 NOTE — Telephone Encounter (Signed)
Requested Prescriptions  Pending Prescriptions Disp Refills   meloxicam (MOBIC) 7.5 MG tablet 90 tablet 0    Sig: Take 1 tablet (7.5 mg total) by mouth daily.     Analgesics:  COX2 Inhibitors Failed - 04/09/2022  4:04 PM      Failed - Manual Review: Labs are only required if the patient has taken medication for more than 8 weeks.      Passed - HGB in normal range and within 360 days    Hemoglobin  Date Value Ref Range Status  08/15/2021 15.1 13.0 - 17.7 g/dL Final         Passed - Cr in normal range and within 360 days    Creatinine, Ser  Date Value Ref Range Status  02/17/2022 0.78 0.76 - 1.27 mg/dL Final         Passed - HCT in normal range and within 360 days    Hematocrit  Date Value Ref Range Status  08/15/2021 46.3 37.5 - 51.0 % Final         Passed - AST in normal range and within 360 days    AST  Date Value Ref Range Status  08/15/2021 22 0 - 40 IU/L Final         Passed - ALT in normal range and within 360 days    ALT  Date Value Ref Range Status  08/15/2021 30 0 - 44 IU/L Final         Passed - eGFR is 30 or above and within 360 days    GFR calc Af Amer  Date Value Ref Range Status  01/22/2020 121 >59 mL/min/1.73 Final    Comment:    **In accordance with recommendations from the NKF-ASN Task force,**   [facility] is in the process of updating its eGFR calculation to the   2021 CKD-EPI creatinine equation that estimates kidney function   without a race variable.    GFR calc non Af Amer  Date Value Ref Range Status  01/22/2020 105 >59 mL/min/1.73 Final   eGFR  Date Value Ref Range Status  02/17/2022 109 >59 mL/min/1.73 Final         Passed - Patient is not pregnant      Passed - Valid encounter within last 12 months    Recent Outpatient Visits           3 weeks ago Controlled type 2 diabetes mellitus with complication, without long-term current use of insulin ([place])   [place], [place] W, NP   1 month  ago Uncontrolled type 2 diabetes mellitus with hyperglycemia, with long-term current use of insulin [facility])   [place] Elsie Stain, MD   7 months ago Controlled type 2 diabetes mellitus with complication, without long-term current use of insulin [facility])   [place], [place] W, NP   11 months ago Controlled type 2 diabetes mellitus with complication, without long-term current use of insulin [facility])   [place], [place] W, NP   11 months ago Controlled type 2 diabetes mellitus with complication, without long-term current use of insulin [facility])   [place] Guerra, RPH-CPP       Future Appointments             In 1 month Gildardo Pounds, NP [place]

## 2022-04-16 ENCOUNTER — Other Ambulatory Visit: Payer: Self-pay

## 2022-05-13 ENCOUNTER — Other Ambulatory Visit: Payer: Self-pay

## 2022-05-19 ENCOUNTER — Other Ambulatory Visit: Payer: Self-pay

## 2022-05-22 ENCOUNTER — Other Ambulatory Visit: Payer: Self-pay

## 2022-05-22 ENCOUNTER — Encounter: Payer: Self-pay | Admitting: Nurse Practitioner

## 2022-05-22 ENCOUNTER — Ambulatory Visit: Payer: BC Managed Care – PPO | Attending: Nurse Practitioner | Admitting: Nurse Practitioner

## 2022-05-22 VITALS — BP 106/70 | HR 102 | Ht 70.0 in | Wt 210.6 lb

## 2022-05-22 DIAGNOSIS — E118 Type 2 diabetes mellitus with unspecified complications: Secondary | ICD-10-CM | POA: Diagnosis not present

## 2022-05-22 DIAGNOSIS — I10 Essential (primary) hypertension: Secondary | ICD-10-CM

## 2022-05-22 LAB — POCT GLYCOSYLATED HEMOGLOBIN (HGB A1C): Hemoglobin A1C: 9.3 % — AB (ref 4.0–5.6)

## 2022-05-22 MED ORDER — OZEMPIC (0.25 OR 0.5 MG/DOSE) 2 MG/3ML ~~LOC~~ SOPN
0.5000 mg | PEN_INJECTOR | SUBCUTANEOUS | 1 refills | Status: DC
  Filled 2022-05-22: qty 3, 28d supply, fill #0
  Filled 2022-06-05: qty 3, 28d supply, fill #1

## 2022-05-22 NOTE — Progress Notes (Signed)
Assessment & Plan:  Kyandre was seen today for hypertension and diabetes.  Diagnoses and all orders for this visit:  Controlled type 2 diabetes mellitus with complication, without long-term current use of insulin Added ozempic today. D/C trulicity.  Continue metformin, basaglar and humalog.  -     POCT glycosylated hemoglobin (Hb A1C) -     Semaglutide,0.25 or 0.5MG /DOS, (OZEMPIC, 0.25 OR 0.5 MG/DOSE,) 2 MG/3ML SOPN; Inject 0.5 mg into the skin once a week.  Primary hypertension Continue all antihypertensives as prescribed.  Reminded to bring in blood pressure log for follow  up appointment.  RECOMMENDATIONS: DASH/Mediterranean Diets are healthier choices for HTN.      Patient has been counseled on age-appropriate routine health concerns for screening and prevention. These are reviewed and up-to-date. Referrals have been placed accordingly. Immunizations are up-to-date or declined.    Subjective:   Chief Complaint  Patient presents with   Hypertension   Diabetes   Hypertension Pertinent negatives include no blurred vision, chest pain, headaches, malaise/fatigue, palpitations or shortness of breath.  Diabetes Pertinent negatives for hypoglycemia include no dizziness, headaches or seizures. Pertinent negatives for diabetes include no blurred vision, no chest pain and no weight loss.   Lance Howell 52 y.o. male presents to office today for follow up to DM and HTN  He has a past medical history of Diabetes mellitus without complication, Hyperlipidemia, and Hypertension.   VRI was used to communicate directly with patient for the entire encounter including providing detailed patient instructions.    DM 2 Poorly controlled. He is currently taking metformin 1000 mg BID Basaglar 20 units daily and Humalog 5 units with each meal (twice per day). He is also taking Trulicity 1.5 mg weekly. He has been taking this for quite some time. We were not able to obtain the 4 mg weekly pen for  him in the past. Reports abdominal tenderness near the sites he is administering insulin. The tenderness is present on both sides of has abdomen. Pain is only elicited with palpation of these areas. Denies N/V. He is using the smallest needle for his pens at this time.  7 day average 217 14 day average 176 30 day average 189 Lab Results  Component Value Date   HGBA1C 9.3 (A) 05/22/2022    Lab Results  Component Value Date   HGBA1C 8.7 (A) 08/15/2021     HTN Blood pressure is well controlled. He is taking amlodipine 5 mg daily and Diovan-Hct 320-25 mg daily.  BP Readings from Last 3 Encounters:  05/22/22 106/70  03/20/22 107/66  02/17/22 (!) 136/94     Review of Systems  Constitutional:  Negative for fever, malaise/fatigue and weight loss.  HENT: Negative.  Negative for nosebleeds.   Eyes: Negative.  Negative for blurred vision, double vision and photophobia.  Respiratory: Negative.  Negative for cough and shortness of breath.   Cardiovascular: Negative.  Negative for chest pain, palpitations and leg swelling.  Gastrointestinal:  Positive for abdominal pain. Negative for blood in stool, constipation, diarrhea, heartburn, melena, nausea and vomiting.  Musculoskeletal: Negative.  Negative for myalgias.  Neurological: Negative.  Negative for dizziness, focal weakness, seizures and headaches.  Psychiatric/Behavioral: Negative.  Negative for suicidal ideas.     Past Medical History:  Diagnosis Date   Diabetes mellitus without complication    Hyperlipidemia    Hypertension     Past Surgical History:  Procedure Laterality Date   APPENDECTOMY     kidney stone removal  Family History  Problem Relation Age of Onset   Hypertension Mother    Diabetes Father     Social History Reviewed with no changes to be made today.   Outpatient Medications Prior to Visit  Medication Sig Dispense Refill   amLODipine (NORVASC) 5 MG tablet Take 1 tablet (5 mg total) by mouth daily. 90  tablet 2   Blood Glucose Monitoring Suppl (TRUE METRIX METER) w/Device KIT Use as instructed. 1 kit 0   butalbital-acetaminophen-caffeine (FIORICET) 50-325-40 MG tablet Take 1-2 tablets by mouth every 6 (six) hours as needed for headache. 30 tablet 1   glucose blood test strip Use as instructed. Check blood glucose level by fingerstick twice per day. 200 each 12   Insulin Glargine (BASAGLAR KWIKPEN) 100 UNIT/ML Inject 20 Units into the skin daily. 15 mL 2   insulin lispro (HUMALOG) 100 UNIT/ML KwikPen 5 units twice daily before largest meal of the day 15 mL 1   Insulin Pen Needle (TRUEPLUS 5-BEVEL PEN NEEDLES) 32G X 4 MM MISC Use to inject Basaglar once daily and Humalog twice daily. 100 each 3   meloxicam (MOBIC) 7.5 MG tablet Take 1 tablet (7.5 mg total) by mouth daily. 90 tablet 0   metFORMIN (GLUCOPHAGE) 1000 MG tablet Take 1 tablet (1,000 mg total) by mouth 2 (two) times daily with a meal. 360 tablet 1   rosuvastatin (CRESTOR) 5 MG tablet Take 1 tablet (5 mg total) by mouth daily. 30 tablet 2   traZODone (DESYREL) 100 MG tablet Take 1 tablet (100 mg total) by mouth at bedtime. 30 tablet 2   TRUEplus Lancets 28G MISC Use as instructed. Check blood glucose level by fingerstick twice per day. 200 each 12   valsartan-hydrochlorothiazide (DIOVAN-HCT) 320-25 MG tablet Take 1 tablet by mouth daily. 90 tablet 3   Dulaglutide (TRULICITY) 1.5 MG/0.5ML SOPN Inject 1.5 mg into the skin once a week. 2 mL 2   No facility-administered medications prior to visit.    No Known Allergies     Objective:    BP 106/70   Pulse (!) 102   Ht 5\' 10"  (1.778 m)   Wt 210 lb 9.6 oz (95.5 kg)   SpO2 96%   BMI 30.22 kg/m  Wt Readings from Last 3 Encounters:  05/22/22 210 lb 9.6 oz (95.5 kg)  03/20/22 203 lb 9.6 oz (92.4 kg)  02/17/22 207 lb 3.2 oz (94 kg)    Physical Exam Vitals and nursing note reviewed.  Constitutional:      Appearance: He is well-developed.  HENT:     Head: Normocephalic and  atraumatic.  Cardiovascular:     Rate and Rhythm: Normal rate and regular rhythm.     Heart sounds: Normal heart sounds. No murmur heard.    No friction rub. No gallop.  Pulmonary:     Effort: Pulmonary effort is normal. No tachypnea or respiratory distress.     Breath sounds: Normal breath sounds. No decreased breath sounds, wheezing, rhonchi or rales.  Chest:     Chest wall: No tenderness.  Abdominal:     General: Bowel sounds are normal.     Palpations: Abdomen is soft.     Tenderness: There is abdominal tenderness in the right upper quadrant, right lower quadrant, left upper quadrant and left lower quadrant.     Hernia: A hernia is present. Hernia is present in the umbilical area.  Musculoskeletal:        General: Normal range of motion.  Cervical back: Normal range of motion.  Skin:    General: Skin is warm and dry.  Neurological:     Mental Status: He is alert and oriented to person, place, and time.     Coordination: Coordination normal.  Psychiatric:        Behavior: Behavior normal. Behavior is cooperative.        Thought Content: Thought content normal.        Judgment: Judgment normal.          Patient has been counseled extensively about nutrition and exercise as well as the importance of adherence with medications and regular follow-up. The patient was given clear instructions to go to ER or return to medical center if symptoms don't improve, worsen or new problems develop. The patient verbalized understanding.   Follow-up: Return for 4-6 weeks f/u [facility] meter check. see me in 3 months.Claiborne Rigg, FNP-BC [facility] and [facility] [place], [place] [phone]   05/22/2022, 3:57 PM

## 2022-05-23 LAB — CMP14+EGFR
ALT: 56 IU/L — ABNORMAL HIGH (ref 0–44)
AST: 30 IU/L (ref 0–40)
Albumin/Globulin Ratio: 1.9 (ref 1.2–2.2)
Albumin: 5 g/dL (ref 4.1–5.1)
Alkaline Phosphatase: 102 IU/L (ref 44–121)
BUN/Creatinine Ratio: 22 — ABNORMAL HIGH (ref 9–20)
BUN: 19 mg/dL (ref 6–24)
Bilirubin Total: <0.2 mg/dL (ref 0.0–1.2)
CO2: 25 mmol/L (ref 20–29)
Calcium: 10.5 mg/dL — ABNORMAL HIGH (ref 8.7–10.2)
Chloride: 99 mmol/L (ref 96–106)
Creatinine, Ser: 0.88 mg/dL (ref 0.76–1.27)
Globulin, Total: 2.7 g/dL (ref 1.5–4.5)
Glucose: 269 mg/dL — ABNORMAL HIGH (ref 70–99)
Potassium: 4.7 mmol/L (ref 3.5–5.2)
Sodium: 140 mmol/L (ref 134–144)
Total Protein: 7.7 g/dL (ref 6.0–8.5)
eGFR: 105 mL/min/{1.73_m2} (ref >59)

## 2022-05-25 ENCOUNTER — Other Ambulatory Visit: Payer: Self-pay

## 2022-06-04 ENCOUNTER — Other Ambulatory Visit: Payer: Self-pay

## 2022-06-05 ENCOUNTER — Other Ambulatory Visit: Payer: Self-pay

## 2022-06-15 ENCOUNTER — Other Ambulatory Visit: Payer: Self-pay

## 2022-06-15 ENCOUNTER — Other Ambulatory Visit: Payer: Self-pay | Admitting: Critical Care Medicine

## 2022-06-15 DIAGNOSIS — E785 Hyperlipidemia, unspecified: Secondary | ICD-10-CM

## 2022-06-15 MED ORDER — ROSUVASTATIN CALCIUM 5 MG PO TABS
5.0000 mg | ORAL_TABLET | Freq: Every day | ORAL | 0 refills | Status: DC
Start: 2022-06-15 — End: 2022-09-07
  Filled 2022-06-15: qty 90, 90d supply, fill #0

## 2022-06-19 ENCOUNTER — Other Ambulatory Visit: Payer: Self-pay

## 2022-07-03 ENCOUNTER — Encounter: Payer: Self-pay | Admitting: Pharmacist

## 2022-07-03 ENCOUNTER — Other Ambulatory Visit: Payer: Self-pay

## 2022-07-03 ENCOUNTER — Ambulatory Visit: Payer: BC Managed Care – PPO | Attending: Nurse Practitioner | Admitting: Pharmacist

## 2022-07-03 DIAGNOSIS — Z794 Long term (current) use of insulin: Secondary | ICD-10-CM | POA: Diagnosis not present

## 2022-07-03 DIAGNOSIS — E118 Type 2 diabetes mellitus with unspecified complications: Secondary | ICD-10-CM

## 2022-07-03 DIAGNOSIS — E1165 Type 2 diabetes mellitus with hyperglycemia: Secondary | ICD-10-CM

## 2022-07-03 MED ORDER — GLUCOSE BLOOD VI STRP
ORAL_STRIP | 2 refills | Status: DC
Start: 2022-07-03 — End: 2023-02-24
  Filled 2022-07-03: qty 100, 50d supply, fill #0
  Filled 2022-10-14: qty 100, 50d supply, fill #1

## 2022-07-03 MED ORDER — PEN NEEDLES 32G X 4 MM MISC
6 refills | Status: AC
  Filled 2022-07-03: qty 100, 33d supply, fill #0

## 2022-07-03 MED ORDER — TRUE METRIX METER W/DEVICE KIT
PACK | 0 refills | Status: DC
Start: 2022-07-03 — End: 2023-02-24
  Filled 2022-07-03: qty 1, 30d supply, fill #0

## 2022-07-03 MED ORDER — SEMAGLUTIDE (1 MG/DOSE) 4 MG/3ML ~~LOC~~ SOPN
1.0000 mg | PEN_INJECTOR | SUBCUTANEOUS | 2 refills | Status: DC
  Filled 2022-07-03: qty 9, 84d supply, fill #0

## 2022-07-03 MED ORDER — TRUEPLUS LANCETS 28G MISC
2 refills | Status: AC
Start: 2022-07-03 — End: ?
  Filled 2022-07-03: qty 100, 50d supply, fill #0

## 2022-07-03 NOTE — Progress Notes (Signed)
   Interpreter: Gemma Payor  S:    PCP: Zelda   No chief complaint on file.  Patient arrives in good spirits.  Presents for diabetes evaluation, education, and management. Patient was referred on 05/22/2022 by Zelda. A1c at that visit was up to 9.3% and Zelda changed his Trulicity to Ozempic. Of note, pharmacy has seen him before.   Today, pt is in good spirits. He is with Denies any NV, abdominal pain since changing to Ozempic. He has no complaints today.  Family/Social History:  -Fhx: HTN, DM -Tobacco: never smoker  -Alcohol: denies use   Insurance coverage/medication affordability: BCBS  Medication adherence reported. Current diabetes medications include: Basaglar 20u daily, Humalog 5u BID, metformin 1000 mg BID, Ozempic 0.5 mg weekly  Patient denies hypoglycemic events.  Reported CBG levels:  Fasting: 150 - 160. Denies >200.   Patient reported dietary habits: Eats 2 meals/day - Usually eats rice or beans with meals - Eats ~2-5 tortillas daily  -  Admits to loving something sweet with meals but avoids these  - Drinks only water   Patient-reported exercise habits: none outside of work   Patient denies nocturia (nighttime urination).  Patient denies neuropathy (nerve pain). Patient denies visual changes. Patient reports self foot exams.    O:   Lab Results  Component Value Date   HGBA1C 9.3 (A) 05/22/2022   There were no vitals filed for this visit.  Lipid Panel     Component Value Date/Time   CHOL 120 02/17/2022 1627   TRIG 93 02/17/2022 1627   HDL 50 02/17/2022 1627   CHOLHDL 2.4 02/17/2022 1627   LDLCALC 52 02/17/2022 1627    Clinical Atherosclerotic Cardiovascular Disease (ASCVD): No  The ASCVD Risk score (Arnett DK, et al., 2019) failed to calculate for the following reasons:   The valid total cholesterol range is 130 to 320 mg/dL  A/P: Diabetes longstanding currently uncontrolled given his A1c. He is tolerating the Ozempic well. No hyperglycemia  or hypoglycemia reported at this time. Patient is able to verbalize appropriate hypoglycemia management plan. Medication adherence appears to be okay.  -Continue metformin 1000 mg BID.  -Increase Ozempic to 1 mg weekly.  -Continue Basaglar 20u daily.  -Continue Humalog 5u BID.  -Extensively discussed pathophysiology of diabetes, recommended lifestyle interventions, dietary effects on blood sugar control -Counseled on s/sx of and management of hypoglycemia -Next A1C anticipated 07/2021.  Written patient instructions provided.  Total time in face to face counseling 45 minutes.   Follow up w/ me in 1 month.   Butch Penny, PharmD, Patsy [place], CPP Clinical Pharmacist [facility] & [facility] [phone]

## 2022-07-13 ENCOUNTER — Other Ambulatory Visit: Payer: Self-pay

## 2022-07-24 ENCOUNTER — Other Ambulatory Visit: Payer: Self-pay

## 2022-08-07 ENCOUNTER — Encounter: Payer: Self-pay | Admitting: Pharmacist

## 2022-08-07 ENCOUNTER — Other Ambulatory Visit: Payer: Self-pay | Admitting: Critical Care Medicine

## 2022-08-07 ENCOUNTER — Other Ambulatory Visit: Payer: Self-pay

## 2022-08-07 ENCOUNTER — Other Ambulatory Visit: Payer: Self-pay | Admitting: Nurse Practitioner

## 2022-08-07 ENCOUNTER — Ambulatory Visit: Payer: BC Managed Care – PPO | Attending: Nurse Practitioner | Admitting: Pharmacist

## 2022-08-07 DIAGNOSIS — M25561 Pain in right knee: Secondary | ICD-10-CM

## 2022-08-07 DIAGNOSIS — G8929 Other chronic pain: Secondary | ICD-10-CM

## 2022-08-07 DIAGNOSIS — G43C0 Periodic headache syndromes in child or adult, not intractable: Secondary | ICD-10-CM | POA: Diagnosis not present

## 2022-08-07 MED ORDER — MELOXICAM 7.5 MG PO TABS
7.5000 mg | ORAL_TABLET | Freq: Every day | ORAL | 0 refills | Status: DC
  Filled 2022-08-07: qty 90, 90d supply, fill #0

## 2022-08-07 MED ORDER — SEMAGLUTIDE (2 MG/DOSE) 8 MG/3ML ~~LOC~~ SOPN
2.0000 mg | PEN_INJECTOR | SUBCUTANEOUS | 1 refills | Status: DC
  Filled 2022-08-07: qty 3, 28d supply, fill #0
  Filled 2022-09-07: qty 3, 28d supply, fill #1
  Filled 2022-10-05: qty 3, 28d supply, fill #2
  Filled 2022-11-10: qty 3, 28d supply, fill #3

## 2022-08-07 MED ORDER — BASAGLAR KWIKPEN 100 UNIT/ML ~~LOC~~ SOPN
26.0000 [IU] | PEN_INJECTOR | Freq: Every day | SUBCUTANEOUS | 2 refills | Status: DC
  Filled 2022-08-07: qty 9, 34d supply, fill #0

## 2022-08-07 MED ORDER — BUTALBITAL-APAP-CAFFEINE 50-325-40 MG PO TABS
1.0000 | ORAL_TABLET | Freq: Four times a day (QID) | ORAL | 1 refills | Status: DC | PRN
Start: 2022-08-07 — End: 2023-05-31

## 2022-08-07 NOTE — Progress Notes (Signed)
   Interpreter: Corrinne Eagle #[id number]  S:    PCP: Zelda   No chief complaint on file.  Patient arrives in good spirits.  Presents for diabetes evaluation, education, and management. Patient was referred on 05/22/2022 by Zelda. A1c at that visit was up to 9.3% and Zelda changed his Trulicity to Ozempic. I saw him on 07/03/2022 and he reported blood sugars in the 150s-160s.  Today, pt is in good spirits. He is with his wife. Denies any NV, abdominal pain since taking 1mg  daily of Ozempic. Of note, he tells me today that he did not tell me the truth regarding home blood sugar control at his last visit. He brings his meter with him today and his avgs are listed below. Instead of mid 100s, Mr. Desman has been running in the 200s-sometimes 300s. He is endorsing polyuria, polydipsia, and blurred vision today.   Family/Social History:  -Fhx: HTN, DM -Tobacco: never smoker  -Alcohol: denies use   Insurance coverage/medication affordability: BCBS  Medication adherence reported. Current diabetes medications include: Basaglar 20u daily, Humalog 5u BID, metformin 1000 mg BID, Ozempic 1 mg weekly  Patient denies hypoglycemic events.  Patient reported dietary habits: Eats 2 meals/day - Usually eats rice or beans with meals - Eats ~2-5 tortillas daily  -  Admits to loving something sweet with meals but avoids these  - Drinks only water   Patient-reported exercise habits: none outside of work   O:   Human resources officer Value Date   HGBA1C 9.3 (A) 05/22/2022   There were no vitals filed for this visit.  Lipid Panel     Component Value Date/Time   CHOL 120 02/17/2022 1627   TRIG 93 02/17/2022 1627   HDL 50 02/17/2022 1627   CHOLHDL 2.4 02/17/2022 1627   LDLCALC 52 02/17/2022 1627    Clinical Atherosclerotic Cardiovascular Disease (ASCVD): No  The ASCVD Risk score (Arnett DK, et al., 2019) failed to calculate for the following reasons:   The valid total cholesterol range is 130 to 320  mg/dL  A/P: Diabetes longstanding currently uncontrolled given his A1c. He is tolerating the Ozempic well. Unfortunately, he has not been honest about his blood sugars. I counseled him to always report symptoms of hyperglycemia as they occur and to not withhold information. We will need to adjust insulin and Ozempic doses today. No hypoglycemia reported at this time. Patient is able to verbalize appropriate hypoglycemia management plan. Medication adherence appears to be okay.  -Continue metformin 1000 mg BID.  -Increase Ozempic to 2 mg weekly.  -Increase Basaglar to 26u daily.  -Continue Humalog 5u BID.  -Extensively discussed pathophysiology of diabetes, recommended lifestyle interventions, dietary effects on blood sugar control -Counseled on s/sx of and management of hypoglycemia -Next A1C anticipated 08/2022.  Written patient instructions provided.  Total time in face to face counseling 45 minutes.   Follow up w/ me in 2 months. Zelda next month for A1c.   Butch Penny, PharmD, P[place]y [place], CPP Clinical Pharmacist [facility] & [facility] [phone]

## 2022-08-14 ENCOUNTER — Other Ambulatory Visit: Payer: Self-pay

## 2022-08-28 ENCOUNTER — Other Ambulatory Visit: Payer: Self-pay

## 2022-08-28 ENCOUNTER — Ambulatory Visit: Payer: BC Managed Care – PPO | Attending: Nurse Practitioner | Admitting: Nurse Practitioner

## 2022-08-28 ENCOUNTER — Encounter: Payer: Self-pay | Admitting: Nurse Practitioner

## 2022-08-28 VITALS — BP 116/76 | HR 75 | Ht 70.0 in | Wt 211.8 lb

## 2022-08-28 DIAGNOSIS — I10 Essential (primary) hypertension: Secondary | ICD-10-CM

## 2022-08-28 DIAGNOSIS — R109 Unspecified abdominal pain: Secondary | ICD-10-CM

## 2022-08-28 DIAGNOSIS — E1165 Type 2 diabetes mellitus with hyperglycemia: Secondary | ICD-10-CM

## 2022-08-28 DIAGNOSIS — M79602 Pain in left arm: Secondary | ICD-10-CM

## 2022-08-28 DIAGNOSIS — Z794 Long term (current) use of insulin: Secondary | ICD-10-CM

## 2022-08-28 DIAGNOSIS — R6882 Decreased libido: Secondary | ICD-10-CM

## 2022-08-28 LAB — POCT GLYCOSYLATED HEMOGLOBIN (HGB A1C): Hemoglobin A1C: 9.8 % — AB (ref 4.0–5.6)

## 2022-08-28 MED ORDER — VALSARTAN-HYDROCHLOROTHIAZIDE 320-25 MG PO TABS
1.0000 | ORAL_TABLET | Freq: Every day | ORAL | 3 refills | Status: DC
Start: 2022-08-28 — End: 2023-10-25
  Filled 2022-08-28: qty 90, 90d supply, fill #0
  Filled 2022-11-10: qty 30, 30d supply, fill #0
  Filled 2022-12-10: qty 30, 30d supply, fill #1
  Filled 2023-01-13: qty 30, 30d supply, fill #2
  Filled 2023-02-15: qty 30, 30d supply, fill #3
  Filled 2023-03-17: qty 30, 30d supply, fill #4
  Filled 2023-04-16: qty 30, 30d supply, fill #5
  Filled 2023-05-17 (×2): qty 30, 30d supply, fill #6
  Filled 2023-06-21 (×2): qty 30, 30d supply, fill #7
  Filled 2023-07-20 (×2): qty 30, 30d supply, fill #8

## 2022-08-28 MED ORDER — BASAGLAR KWIKPEN 100 UNIT/ML ~~LOC~~ SOPN
30.0000 [IU] | PEN_INJECTOR | Freq: Every day | SUBCUTANEOUS | 2 refills | Status: DC
  Filled 2022-08-28: qty 9, 30d supply, fill #0

## 2022-08-28 MED ORDER — AMLODIPINE BESYLATE 5 MG PO TABS
5.0000 mg | ORAL_TABLET | Freq: Every day | ORAL | 2 refills | Status: DC
Start: 2022-08-28 — End: 2023-05-31
  Filled 2022-08-28 – 2022-09-17 (×3): qty 90, 90d supply, fill #0
  Filled 2022-12-04 (×2): qty 90, 90d supply, fill #1
  Filled 2023-03-17: qty 30, 30d supply, fill #2
  Filled 2023-04-16: qty 30, 30d supply, fill #3
  Filled 2023-05-17 (×2): qty 30, 30d supply, fill #4

## 2022-08-28 MED ORDER — DICLOFENAC SODIUM 1 % EX GEL
4.0000 g | Freq: Four times a day (QID) | CUTANEOUS | 1 refills | Status: DC
Start: 2022-08-28 — End: 2023-05-31
  Filled 2022-08-28: qty 200, 13d supply, fill #0

## 2022-08-28 NOTE — Progress Notes (Signed)
Assessment & Plan:  Lance Howell was seen today for diabetes.  Diagnoses and all orders for this visit:  Uncontrolled type 2 diabetes mellitus with hyperglycemia, with long-term current use of insulin (HCC) Increase basaglar to 30 units daily.  If more than 1 large meal daily. Add an additional dose of 5 units to each large meal.  -     POCT glycosylated hemoglobin (Hb A1C) -     CMP14+EGFR  Primary hypertension -     amLODipine (NORVASC) 5 MG tablet; Take 1 tablet (5 mg total) by mouth daily. -     valsartan-hydrochlorothiazide (DIOVAN-HCT) 320-25 MG tablet; Take 1 tablet by mouth daily. -     CMP14+EGFR Continue all antihypertensives as prescribed.  Reminded to bring in blood pressure log for follow  up appointment.  RECOMMENDATIONS: DASH/Mediterranean Diets are healthier choices for HTN.    Arm pain, left -     diclofenac Sodium (VOLTAREN) 1 % GEL; Apply 4 g topically 4 (four) times daily.  Right flank pain -     Urinalysis, Complete Water intake should be 64-80oz daily.  Avoid foods high in sodium   Libido, decreased -     Testosterone, free    Patient has been counseled on age-appropriate routine health concerns for screening and prevention. These are reviewed and up-to-date. Referrals have been placed accordingly. Immunizations are up-to-date or declined.    Subjective:   Chief Complaint  Patient presents with   Diabetes    Lance Howell 52 y.o. male presents to office today for follow up to DM  He is accompanied by his wife today.  VRI was used to communicate directly with patient for the entire encounter including providing detailed patient instructions.      DM 2 A1c is increased today. Insulin was increased to 26 units from 20 and Ozempic increased to 2mg  weekly at his last visit 08-07-2022 with the pharmacist. He was instructed to continue humalog 5 units daily before largest meal, and metformin 1000 mg BID.  He does not feel ozempic is curbing his  appetite. His meter readings are as follows:  7 day  average 212 14 day average 220 30 day average 222 Lab Results  Component Value Date   HGBA1C 9.8 (A) 08/28/2022    Lab Results  Component Value Date   HGBA1C 9.3 (A) 05/22/2022     HTN Blood pressure is well controlled with diovan hct 320-25 mg daily.  BP Readings from Last 3 Encounters:  08/28/22 116/76  05/22/22 106/70  03/20/22 107/66      Experiencing pain in the left antecubital space. Onset of left arm pain he feels was when ozempic was increased to 2mg  last month.     Wants medication to clean the kidneys. Has has been experiencing right flank pain and states he has a history of kidney stones and would like a medication to help clean his kidneys out to prevent stones.   States libido is non existent. He does not desire to have sex with his wife. Denies any erectile dysfunction.   Review of Systems  Constitutional:  Negative for fever, malaise/fatigue and weight loss.  HENT: Negative.  Negative for nosebleeds.   Eyes: Negative.  Negative for blurred vision, double vision and photophobia.  Respiratory: Negative.  Negative for cough and shortness of breath.   Cardiovascular: Negative.  Negative for chest pain, palpitations and leg swelling.  Gastrointestinal: Negative.  Negative for heartburn, nausea and vomiting.  Genitourinary:  Positive for flank pain.  Negative for dysuria, frequency, hematuria and urgency.       SEE HPI  Musculoskeletal:  Positive for back pain. Negative for myalgias.  Neurological: Negative.  Negative for dizziness, focal weakness, seizures and headaches.  Psychiatric/Behavioral: Negative.  Negative for suicidal ideas.     Past Medical History:  Diagnosis Date   Diabetes mellitus without complication (HCC)    Hyperlipidemia    Hypertension     Past Surgical History:  Procedure Laterality Date   APPENDECTOMY     kidney stone removal      Family History  Problem Relation Age of Onset    Hypertension Mother    Diabetes Father     Social History Reviewed with no changes to be made today.   Outpatient Medications Prior to Visit  Medication Sig Dispense Refill   Blood Glucose Monitoring Suppl (TRUE METRIX METER) w/Device KIT Use as instructed. 1 kit 0   butalbital-acetaminophen-caffeine (FIORICET) 50-325-40 MG tablet Take 1-2 tablets by mouth every 6 (six) hours as needed for headache. 30 tablet 1   glucose blood test strip Use as instructed. Check blood glucose level by fingerstick twice per day. 100 each 2   Insulin Glargine (BASAGLAR KWIKPEN) 100 UNIT/ML Inject 26 Units into the skin daily. 9 mL 2   insulin lispro (HUMALOG) 100 UNIT/ML KwikPen 5 units twice daily before largest meal of the day 15 mL 1   Insulin Pen Needle (PEN NEEDLES) 32G X 4 MM MISC Use to inject Basaglar once daily and Humalog twice daily. 100 each 6   meloxicam (MOBIC) 7.5 MG tablet Take 1 tablet (7.5 mg total) by mouth daily. 90 tablet 0   metFORMIN (GLUCOPHAGE) 1000 MG tablet Take 1 tablet (1,000 mg total) by mouth 2 (two) times daily with a meal. 360 tablet 1   rosuvastatin (CRESTOR) 5 MG tablet Take 1 tablet (5 mg total) by mouth daily. 90 tablet 0   Semaglutide, 2 MG/DOSE, 8 MG/3ML SOPN Inject 2 mg as directed once a week. 9 mL 1   traZODone (DESYREL) 100 MG tablet Take 1 tablet (100 mg total) by mouth at bedtime. 30 tablet 2   TRUEplus Lancets 28G MISC Use as instructed. Check blood glucose level by fingerstick twice per day. 100 each 2   amLODipine (NORVASC) 5 MG tablet Take 1 tablet (5 mg total) by mouth daily. 90 tablet 2   valsartan-hydrochlorothiazide (DIOVAN-HCT) 320-25 MG tablet Take 1 tablet by mouth daily. 90 tablet 3   No facility-administered medications prior to visit.    No Known Allergies     Objective:    BP 116/76 (BP Location: Left Arm, Patient Position: Sitting, Cuff Size: Normal)   Pulse 75   Ht 5\' 10"  (1.778 m)   Wt 211 lb 12.8 oz (96.1 kg)   SpO2 98%   BMI 30.39  kg/m  Wt Readings from Last 3 Encounters:  08/28/22 211 lb 12.8 oz (96.1 kg)  05/22/22 210 lb 9.6 oz (95.5 kg)  03/20/22 203 lb 9.6 oz (92.4 kg)    Physical Exam Vitals and nursing note reviewed.  Constitutional:      Appearance: He is well-developed.  HENT:     Head: Normocephalic and atraumatic.  Cardiovascular:     Rate and Rhythm: Normal rate and regular rhythm.     Heart sounds: Normal heart sounds. No murmur heard.    No friction rub. No gallop.  Pulmonary:     Effort: Pulmonary effort is normal. No tachypnea or respiratory distress.  Breath sounds: Normal breath sounds. No decreased breath sounds, wheezing, rhonchi or rales.  Chest:     Chest wall: No tenderness.  Abdominal:     General: Bowel sounds are normal.     Palpations: Abdomen is soft.  Musculoskeletal:        General: Normal range of motion.     Cervical back: Normal range of motion.  Skin:    General: Skin is warm and dry.  Neurological:     Mental Status: He is alert and oriented to person, place, and time.     Coordination: Coordination normal.  Psychiatric:        Behavior: Behavior normal. Behavior is cooperative.        Thought Content: Thought content normal.        Judgment: Judgment normal.          Patient has been counseled extensively about nutrition and exercise as well as the importance of adherence with medications and regular follow-up. The patient was given clear instructions to go to ER or return to medical center if symptoms don't improve, worsen or new problems develop. The patient verbalized understanding.   Follow-up: Return in about 6 weeks (around 10/09/2022) for meter check with luke.  see me in 3 months.   Claiborne Rigg, FNP-BC [facility] and [facility] [place], [place] [phone]   08/28/2022, 11:16 PM

## 2022-08-28 NOTE — Patient Instructions (Addendum)
Increase Basaglar by 2 units every 3 days for morning blood glucose readings greater than 130.      Conservator, museum/gallery en 2 unidades cada 3 das para lecturas de glucosa en sangre matutinas superiores a 130.

## 2022-08-31 ENCOUNTER — Other Ambulatory Visit: Payer: Self-pay

## 2022-09-01 ENCOUNTER — Other Ambulatory Visit: Payer: Self-pay

## 2022-09-04 ENCOUNTER — Other Ambulatory Visit: Payer: Self-pay

## 2022-09-07 ENCOUNTER — Other Ambulatory Visit: Payer: Self-pay

## 2022-09-07 ENCOUNTER — Other Ambulatory Visit: Payer: Self-pay | Admitting: Family Medicine

## 2022-09-07 DIAGNOSIS — E785 Hyperlipidemia, unspecified: Secondary | ICD-10-CM

## 2022-09-08 ENCOUNTER — Other Ambulatory Visit: Payer: Self-pay

## 2022-09-08 MED ORDER — ROSUVASTATIN CALCIUM 5 MG PO TABS
5.0000 mg | ORAL_TABLET | Freq: Every day | ORAL | 0 refills | Status: DC
Start: 2022-09-08 — End: 2022-12-04
  Filled 2022-09-08: qty 90, 90d supply, fill #0

## 2022-09-08 NOTE — Telephone Encounter (Signed)
Requested Prescriptions  Pending Prescriptions Disp Refills   rosuvastatin (CRESTOR) 5 MG tablet 90 tablet 0    Sig: Take 1 tablet (5 mg total) by mouth daily.     Cardiovascular:  Antilipid - Statins 2 Failed - 09/07/2022  4:31 PM      Failed - Lipid Panel in normal range within the last 12 months    Cholesterol, Total  Date Value Ref Range Status  02/17/2022 120 100 - 199 mg/dL Final   LDL Chol Calc (NIH)  Date Value Ref Range Status  02/17/2022 52 0 - 99 mg/dL Final   HDL  Date Value Ref Range Status  02/17/2022 50 >39 mg/dL Final   Triglycerides  Date Value Ref Range Status  02/17/2022 93 0 - 149 mg/dL Final         Passed - Cr in normal range and within 360 days    Creatinine, Ser  Date Value Ref Range Status  05/22/2022 0.88 0.76 - 1.27 mg/dL Final         Passed - Patient is not pregnant      Passed - Valid encounter within last 12 months    Recent Outpatient Visits           1 week ago Uncontrolled type 2 diabetes mellitus with hyperglycemia, with long-term current use of insulin (HCC)   [facility] [place], [place] W, NP   1 month ago Periodic headache syndrome, not intractable   [facility] [place], [place] L, RPH-CPP   2 months ago Uncontrolled type 2 diabetes mellitus with hyperglycemia, with long-term current use of insulin [facility])   [facility] [place], [place] L, RPH-CPP   3 months ago Controlled type 2 diabetes mellitus with complication, without long-term current use of insulin [facility])   [place] [facility] [place], [place] W, NP   5 months ago Controlled type 2 diabetes mellitus with complication, without long-term current use of insulin [facility])   [facility] Claiborne Rigg, NP       Future Appointments             In 1 month Lois Huxley, Cornelius Moras, RPH-CPP [facility]   In 2 months Claiborne Rigg, NP [facility]

## 2022-09-15 ENCOUNTER — Other Ambulatory Visit: Payer: Self-pay

## 2022-09-17 ENCOUNTER — Other Ambulatory Visit: Payer: Self-pay

## 2022-09-24 ENCOUNTER — Other Ambulatory Visit: Payer: Self-pay

## 2022-10-05 ENCOUNTER — Other Ambulatory Visit: Payer: Self-pay

## 2022-10-09 ENCOUNTER — Other Ambulatory Visit: Payer: Self-pay

## 2022-10-09 ENCOUNTER — Ambulatory Visit: Payer: Self-pay | Attending: Nurse Practitioner | Admitting: Pharmacist

## 2022-10-09 ENCOUNTER — Encounter: Payer: Self-pay | Admitting: Pharmacist

## 2022-10-09 DIAGNOSIS — Z7984 Long term (current) use of oral hypoglycemic drugs: Secondary | ICD-10-CM

## 2022-10-09 DIAGNOSIS — E1165 Type 2 diabetes mellitus with hyperglycemia: Secondary | ICD-10-CM

## 2022-10-09 DIAGNOSIS — Z7985 Long-term (current) use of injectable non-insulin antidiabetic drugs: Secondary | ICD-10-CM

## 2022-10-09 DIAGNOSIS — Z794 Long term (current) use of insulin: Secondary | ICD-10-CM

## 2022-10-09 MED ORDER — BASAGLAR KWIKPEN 100 UNIT/ML ~~LOC~~ SOPN
36.0000 [IU] | PEN_INJECTOR | Freq: Every day | SUBCUTANEOUS | 2 refills | Status: DC
  Filled 2022-10-09: qty 9, 25d supply, fill #0
  Filled 2022-11-10: qty 9, 25d supply, fill #1

## 2022-10-09 NOTE — Progress Notes (Signed)
   InterpreterLenon Oms  S:    PCP: Zelda   No chief complaint on file.  Patient arrives in good spirits.  Presents for diabetes evaluation, education, and management. Patient was referred and last seen on 08/28/2022 by Zelda. Pharmacy saw him in May and June. During that time with [place], he admitted to being dishonest concerning his blood sugar at home. A1c at his visit with Zelda was was up to 9.8% (9.3%). and Zelda changed his Trulicity to [employer]. I saw him on 07/03/2022 and he reported blood sugars in the 150s-160s.  Today, pt is in good spirits. He is alone today. Denies any NV, abdominal pain since taking 2mg  daily of Ozempic. Overall, blood sugars have improved. Has experienced some decrease in appetite but would like more.  No complaints today.  Family/Social History:  -Fhx: HTN, DM -Tobacco: never smoker  -Alcohol: denies use   Insurance coverage/medication affordability: self pay  Medication adherence reported. Current diabetes medications include: Basaglar 20u daily, Humalog 5u BID (does not take consistently d/t schedule), metformin 1000 mg BID, Ozempic 2mg  weekly  Patient denies hypoglycemic events.  Patient reported dietary habits: Eats 2 meals/day - Usually eats rice or beans with meals - Eats ~2-5 tortillas daily  -  Admits to loving something sweet with meals but avoids these  - Drinks only water   Patient-reported exercise habits: none outside of work   O:   Human resources officer Value Date   HGBA1C 9.8 (A) 08/28/2022   There were no vitals filed for this visit.  Lipid Panel     Component Value Date/Time   CHOL 120 02/17/2022 1627   TRIG 93 02/17/2022 1627   HDL 50 02/17/2022 1627   CHOLHDL 2.4 02/17/2022 1627   LDLCALC 52 02/17/2022 1627    Clinical Atherosclerotic Cardiovascular Disease (ASCVD): No  The ASCVD Risk score (Arnett DK, et al., 2019) failed to calculate for the following reasons:   The valid total cholesterol range is 130 to 320  mg/dL  A/P: Diabetes longstanding currently uncontrolled given his A1c. He is tolerating the Ozempic well. We will need to adjust insulin today. I will focus on basal insulin given that he does not take bolus consistently. No hypoglycemia reported at this time. Patient is able to verbalize appropriate hypoglycemia management plan. Medication adherence appears to be okay.  -Continue metformin 1000 mg BID.  -Continue Ozempic 2 mg weekly.  -Increase Basaglar to 36u daily.  -Continue Humalog 5u BID.  -Extensively discussed pathophysiology of diabetes, recommended lifestyle interventions, dietary effects on blood sugar control -Counseled on s/sx of and management of hypoglycemia -Next A1C anticipated 11/2022.  Written patient instructions provided.  Total time in face to face counseling 45 minutes.   Follow up w/ me in 1 months.  Butch Penny, PharmD, Patsy [place], CPP Clinical Pharmacist [facility] [phone]

## 2022-10-14 ENCOUNTER — Other Ambulatory Visit: Payer: Self-pay

## 2022-11-10 ENCOUNTER — Other Ambulatory Visit: Payer: Self-pay

## 2022-11-11 ENCOUNTER — Other Ambulatory Visit: Payer: Self-pay

## 2022-11-16 ENCOUNTER — Other Ambulatory Visit: Payer: Self-pay

## 2022-11-20 ENCOUNTER — Ambulatory Visit: Payer: Self-pay | Attending: Family Medicine | Admitting: Pharmacist

## 2022-11-20 ENCOUNTER — Other Ambulatory Visit: Payer: Self-pay

## 2022-11-20 ENCOUNTER — Encounter: Payer: Self-pay | Admitting: Pharmacist

## 2022-11-20 DIAGNOSIS — E1165 Type 2 diabetes mellitus with hyperglycemia: Secondary | ICD-10-CM

## 2022-11-20 DIAGNOSIS — Z794 Long term (current) use of insulin: Secondary | ICD-10-CM

## 2022-11-20 DIAGNOSIS — Z7985 Long-term (current) use of injectable non-insulin antidiabetic drugs: Secondary | ICD-10-CM

## 2022-11-20 DIAGNOSIS — Z7984 Long term (current) use of oral hypoglycemic drugs: Secondary | ICD-10-CM

## 2022-11-20 LAB — POCT GLYCOSYLATED HEMOGLOBIN (HGB A1C): HbA1c, POC (controlled diabetic range): 9.1 % — AB (ref 0.0–7.0)

## 2022-11-20 MED ORDER — DAPAGLIFLOZIN PROPANEDIOL 10 MG PO TABS
10.0000 mg | ORAL_TABLET | Freq: Every day | ORAL | 2 refills | Status: DC
Start: 2022-11-20 — End: 2022-12-04
  Filled 2022-11-20: qty 30, 30d supply, fill #0

## 2022-11-20 MED ORDER — BASAGLAR KWIKPEN 100 UNIT/ML ~~LOC~~ SOPN
40.0000 [IU] | PEN_INJECTOR | Freq: Every day | SUBCUTANEOUS | 2 refills | Status: DC
  Filled 2022-11-20: qty 12, 30d supply, fill #0

## 2022-11-20 NOTE — Progress Notes (Signed)
   Interpreter: Stephannie Li  S:    PCP: Zelda   No chief complaint on file.  Patient arrives in good spirits.  Presents for diabetes evaluation, education, and management. Patient was referred and last seen on 08/28/2022 by Zelda. Pharmacy saw him last 10/09/2022. During that visit, we increased Basaglar to 36 units. We continued metformin, Humalog, and Ozempic.  Today, pt is in good spirits. He is alone today. Denies any NV, abdominal pain since taking 2mg  daily of Ozempic. Does endorse a smaller appetite.  Tells me today he stopped Humalog ~2 weeks ago. Finds that he does not need it d/t his sugars "being okay". Also endorses burning with the Humalog in the abdomen.   Family/Social History:  -Fhx: HTN, DM -Tobacco: never smoker  -Alcohol: denies use   Insurance coverage/medication affordability: self pay  Medication adherence reported. Current diabetes medications include: Basaglar 36u daily, Humalog 5u BID (does not take consistently), metformin 1000 mg BID, Ozempic 2mg  weekly  Patient denies hypoglycemic events.  Patient reported dietary habits: Eats 2 meals/day - Usually eats rice or beans with meals - Eats ~2-5 tortillas daily  - Admits to loving something sweet with meals but avoids these  - Drinks only water  -Smaller appetite   Patient-reported exercise habits: none outside of work  Patient-reported home blood sugar levels: 130s - 180s. Usually takes fasting. No GM or CGM present today.    O:   Lab Results  Component Value Date   HGBA1C 9.1 (A) 11/20/2022   There were no vitals filed for this visit.  Lipid Panel     Component Value Date/Time   CHOL 120 02/17/2022 1627   TRIG 93 02/17/2022 1627   HDL 50 02/17/2022 1627   CHOLHDL 2.4 02/17/2022 1627   LDLCALC 52 02/17/2022 1627    Clinical Atherosclerotic Cardiovascular Disease (ASCVD): No  The ASCVD Risk score (Arnett DK, et al., 2019) failed to calculate for the following reasons:   The valid total  cholesterol range is 130 to 320 mg/dL  A/P: Diabetes longstanding currently uncontrolled given his A1c. A1c does show a decrease for the first time since 11/2020. Commended him for this but he knows we can do better. He is tolerating the Ozempic well. We will need to adjust insulin today. He stopped Novolog and wasn't taking consistently when I had prescribed it. Will discontinue this today and add [place]. We will also titrate glargine. No hypoglycemia reported at this time. Patient is able to verbalize appropriate hypoglycemia management plan. Medication adherence appears to be okay.  -Continue metformin 1000 mg BID.  -Continue Ozempic 2 mg weekly.  -Increase Basaglar to 40u daily.  -Discontinue Humalog 5u BID.  -Add Farxiga 10 mg daily. -Extensively discussed pathophysiology of diabetes, recommended lifestyle interventions, dietary effects on blood sugar control -Counseled on s/sx of and management of hypoglycemia -Next A1C anticipated 02/2023.  Written patient instructions provided.  Total time in face to face counseling 45 minutes.   Follow up w/ me in 6 weeks.  PCP later this month.  Butch Penny, PharmD, P[place]y [place], CPP Clinical Pharmacist [facility] [phone]

## 2022-11-23 ENCOUNTER — Other Ambulatory Visit: Payer: Self-pay

## 2022-11-30 ENCOUNTER — Other Ambulatory Visit: Payer: Self-pay

## 2022-12-01 ENCOUNTER — Other Ambulatory Visit: Payer: Self-pay

## 2022-12-04 ENCOUNTER — Ambulatory Visit (HOSPITAL_BASED_OUTPATIENT_CLINIC_OR_DEPARTMENT_OTHER): Payer: Self-pay | Admitting: Nurse Practitioner

## 2022-12-04 ENCOUNTER — Other Ambulatory Visit: Payer: Self-pay | Admitting: Nurse Practitioner

## 2022-12-04 ENCOUNTER — Other Ambulatory Visit: Payer: Self-pay

## 2022-12-04 DIAGNOSIS — I10 Essential (primary) hypertension: Secondary | ICD-10-CM

## 2022-12-04 MED ORDER — ROSUVASTATIN CALCIUM 5 MG PO TABS
5.0000 mg | ORAL_TABLET | Freq: Every day | ORAL | 1 refills | Status: DC
  Filled 2022-12-04: qty 90, 90d supply, fill #0
  Filled 2023-03-08: qty 90, 90d supply, fill #1

## 2022-12-04 MED ORDER — DAPAGLIFLOZIN PROPANEDIOL 10 MG PO TABS
10.0000 mg | ORAL_TABLET | Freq: Every day | ORAL | 2 refills | Status: DC
  Filled 2022-12-04: qty 30, 30d supply, fill #0

## 2022-12-04 MED ORDER — MELOXICAM 7.5 MG PO TABS
7.5000 mg | ORAL_TABLET | Freq: Every day | ORAL | 1 refills | Status: DC
Start: 2022-12-04 — End: 2023-05-31
  Filled 2022-12-04: qty 90, 90d supply, fill #0

## 2022-12-04 MED ORDER — SEMAGLUTIDE (2 MG/DOSE) 8 MG/3ML ~~LOC~~ SOPN
2.0000 mg | PEN_INJECTOR | SUBCUTANEOUS | 1 refills | Status: DC
Start: 2022-12-04 — End: 2023-04-26
  Filled 2022-12-04: qty 9, 84d supply, fill #0
  Filled 2022-12-04: qty 3, 28d supply, fill #0
  Filled 2022-12-04: qty 9, 84d supply, fill #0
  Filled 2023-01-05: qty 12, 112d supply, fill #1
  Filled 2023-04-23: qty 3, 28d supply, fill #2

## 2022-12-04 MED ORDER — METFORMIN HCL 1000 MG PO TABS
1000.0000 mg | ORAL_TABLET | Freq: Two times a day (BID) | ORAL | 1 refills | Status: DC
Start: 2022-12-04 — End: 2023-09-29
  Filled 2022-12-04: qty 180, 90d supply, fill #0
  Filled 2022-12-10: qty 60, 30d supply, fill #0
  Filled 2023-01-06: qty 60, 30d supply, fill #1
  Filled 2023-02-15: qty 60, 30d supply, fill #2
  Filled 2023-03-17: qty 60, 30d supply, fill #3
  Filled 2023-04-13: qty 60, 30d supply, fill #4
  Filled 2023-05-17 (×2): qty 60, 30d supply, fill #5
  Filled 2023-06-14: qty 60, 30d supply, fill #6
  Filled 2023-07-13: qty 60, 30d supply, fill #7
  Filled 2023-08-16: qty 60, 30d supply, fill #8
  Filled 2023-08-23 (×2): qty 60, 30d supply, fill #9

## 2022-12-04 MED ORDER — BASAGLAR KWIKPEN 100 UNIT/ML ~~LOC~~ SOPN
40.0000 [IU] | PEN_INJECTOR | Freq: Every day | SUBCUTANEOUS | 2 refills | Status: DC
  Filled 2022-12-04 – 2022-12-10 (×2): qty 12, 30d supply, fill #0
  Filled 2023-01-20: qty 12, 30d supply, fill #1
  Filled 2023-02-24: qty 12, 30d supply, fill #2

## 2022-12-04 NOTE — Progress Notes (Signed)
Lance Howell does not have any questions or concerns today. His A1c is not due today. He is uninsured and does not want to incur any additional costs for office visit today as he just recently saw [name] 2 weeks ago. Blood pressure is well controlled.

## 2022-12-05 LAB — URINALYSIS, COMPLETE
Bilirubin, UA: NEGATIVE
Ketones, UA: NEGATIVE
Leukocytes,UA: NEGATIVE
Nitrite, UA: NEGATIVE
RBC, UA: NEGATIVE
Specific Gravity, UA: >=1.03 — AB (ref 1.005–1.030)
Urobilinogen, Ur: 0.2 mg/dL (ref 0.2–1.0)
pH, UA: 7 (ref 5.0–7.5)

## 2022-12-05 LAB — MICROSCOPIC EXAMINATION
Bacteria, UA: NONE SEEN
Casts: NONE SEEN /[LPF]
Epithelial Cells (non renal): NONE SEEN /[HPF] (ref 0–10)
WBC, UA: NONE SEEN /[HPF] (ref 0–5)

## 2022-12-06 LAB — MICROALBUMIN / CREATININE URINE RATIO
Creatinine, Urine: 118.7 mg/dL
Microalb/Creat Ratio: 25 mg/g{creat} (ref 0–29)
Microalbumin, Urine: 30.1 ug/mL

## 2022-12-08 ENCOUNTER — Other Ambulatory Visit: Payer: Self-pay | Admitting: Nurse Practitioner

## 2022-12-08 DIAGNOSIS — R7989 Other specified abnormal findings of blood chemistry: Secondary | ICD-10-CM

## 2022-12-08 LAB — CMP14+EGFR
ALT: 48 [IU]/L — ABNORMAL HIGH (ref 0–44)
AST: 24 [IU]/L (ref 0–40)
Albumin: 4.6 g/dL (ref 3.8–4.9)
Alkaline Phosphatase: 87 [IU]/L (ref 44–121)
BUN/Creatinine Ratio: 18 (ref 9–20)
BUN: 17 mg/dL (ref 6–24)
Bilirubin Total: 0.2 mg/dL (ref 0.0–1.2)
CO2: 24 mmol/L (ref 20–29)
Calcium: 9.4 mg/dL (ref 8.7–10.2)
Chloride: 104 mmol/L (ref 96–106)
Creatinine, Ser: 0.95 mg/dL (ref 0.76–1.27)
Globulin, Total: 2.2 g/dL (ref 1.5–4.5)
Glucose: 126 mg/dL — ABNORMAL HIGH (ref 70–99)
Potassium: 4.3 mmol/L (ref 3.5–5.2)
Sodium: 142 mmol/L (ref 134–144)
Total Protein: 6.8 g/dL (ref 6.0–8.5)
eGFR: 97 mL/min/{1.73_m2} (ref >59)

## 2022-12-08 LAB — TESTOSTERONE, FREE: Testosterone, Free: 4 pg/mL — ABNORMAL LOW (ref 7.2–24.0)

## 2022-12-10 ENCOUNTER — Other Ambulatory Visit: Payer: Self-pay

## 2022-12-11 ENCOUNTER — Other Ambulatory Visit: Payer: Self-pay

## 2022-12-14 ENCOUNTER — Other Ambulatory Visit: Payer: Self-pay

## 2022-12-18 ENCOUNTER — Other Ambulatory Visit: Payer: Self-pay

## 2022-12-24 NOTE — Progress Notes (Deleted)
S:     No chief complaint on file.  52 y.o. male who presents for diabetes evaluation, education, and management. PMH is significant for HTN, HLD and T2DM. Patient was referred and last seen by Primary Care Provider, Bertram Denver, on 12/04/2022. At that visit uacr was 25 mg/g. Patient was last seen by Pharmacist, Butch Penny, on 11/20/2022. At that visit A1C was 9.1%. At that visit patient denied any N/V,  abdominal pain with Ozempic 2 mg. Patient endorsed burning in the abdomen with Humalog and also stated that he does not need it d/t his sugars "being okay".  Basaglar was increased to 40u daily, Farxiga 10 mg daily was added, and Humalog 5u BID was discontinued. No history of stroke, CHF, or CKD.    Patient arrives in  good spirits and presents without  any assistance. Patient is accompanied by ***.   Patient reports Diabetes was diagnosed in ***.   Family/Social History:  -Fhx: HTN, DM -Tobacco: never smoker  -Alcohol: denies use   Current diabetes medications include: Metformin 1000 mg BID, Ozempic 2 mg weekly, Basaglar 40u daily, Farxiga 10 mg daily Current hypertension medications include: Amlodipine 5 mg daily, valsartan-hydrochlorothiazide 320-25 mg daily Current hyperlipidemia medications include: Rosuvastatin 5 mg   Patient reports adherence to taking all medications as prescribed.  *** Patient denies adherence with medications, reports missing *** medications *** times per week, on average.  Do you feel that your medications are working for you? {YES NO:22349} Have you been experiencing any side effects to the medications prescribed? {YES NO:22349} Do you have any problems obtaining medications due to transportation or finances? {YES [id number] Insurance coverage/medication affordability: self pay   Patient {Actions; denies-reports:120008} hypoglycemic events.  Reported home fasting blood sugars: ***  Reported 2 hour post-meal/random blood sugars: ***.  Patient  {Actions; denies-reports:120008} nocturia (nighttime urination).  Patient {Actions; denies-reports:120008} neuropathy (nerve pain). Patient {Actions; denies-reports:120008} visual changes. Patient {Actions; denies-reports:120008} self foot exams.   Patient reported dietary habits: Eats *** meals/day Breakfast: *** Lunch: *** Dinner: *** Snacks: *** Drinks: ***  Within the past 12 months, did you worry whether your food would run out before you got money to buy more? {YES NO:22349} Within the past 12 months, did the food you bought run out, and you didn't have money to get more? {YES NO:22349} PHQ-9 Score: ***  Patient-reported exercise habits: ***  O:  7 day average blood glucose: ***  Libre3 *** CGM Download today *** on *** % Time CGM is active: ***% Average Glucose: *** mg/dL Glucose Management Indicator: ***  Glucose Variability: ***% (goal <36%) Time in Goal:  - Time in range 70-180: ***% - Time above range: ***% - Time below range: ***% Observed patterns:   Lab Results  Component Value Date   HGBA1C 9.1 (A) 11/20/2022   There were no vitals filed for this visit.  Lipid Panel     Component Value Date/Time   CHOL 120 02/17/2022 1627   TRIG 93 02/17/2022 1627   HDL 50 02/17/2022 1627   CHOLHDL 2.4 02/17/2022 1627   LDLCALC 52 02/17/2022 1627    Clinical Atherosclerotic Cardiovascular Disease (ASCVD): No  The ASCVD Risk score (Arnett DK, et al., 2019) failed to calculate for the following reasons:   The valid total cholesterol range is 130 to 320 mg/dL   A/P: Diabetes longstanding currently uncontrolled given his A1c. Patient is able to verbalize appropriate hypoglycemia management plan. Medication adherence appears ***. Control is suboptimal due to ***. -  Continue metformin 1000 mg BID.  -Continue Ozempic 2 mg weekly. -Continue Farxiga 10 mg daily.  -Continue Basaglar 40u daily??  -Patient educated on purpose, proper use, and potential adverse effects of  ***.  -Extensively discussed pathophysiology of diabetes, recommended lifestyle interventions, dietary effects on blood sugar control.  -Counseled on s/sx of and management of hypoglycemia.  -Next A1c anticipated 02/2023.   Written patient instructions provided. Patient verbalized understanding of treatment plan.  Total time in face to face counseling *** minutes.    Follow-up:  Pharmacist: 1 month  PCP clinic visit: 02/24/2023  Patient seen with:  Erasmo Leventhal, PharmD Candidate  [employer] of 20[employer] [facility] SOP   Butch Penny, PharmD, [place], CPP Clinical Pharmacist [facility] [phone]

## 2022-12-25 ENCOUNTER — Other Ambulatory Visit: Payer: Self-pay

## 2022-12-25 ENCOUNTER — Ambulatory Visit: Payer: Self-pay | Admitting: Pharmacist

## 2023-01-05 ENCOUNTER — Other Ambulatory Visit: Payer: Self-pay

## 2023-01-06 ENCOUNTER — Other Ambulatory Visit: Payer: Self-pay

## 2023-01-13 ENCOUNTER — Other Ambulatory Visit: Payer: Self-pay

## 2023-01-15 ENCOUNTER — Ambulatory Visit: Payer: Self-pay | Admitting: Urology

## 2023-01-15 NOTE — Progress Notes (Unsigned)
   Assessment: 1. Low testosterone      Plan: I personally reviewed the patient's chart including provider notes, and lab results.   Chief Complaint: No chief complaint on file.   History of Present Illness:  Lance Howell is a 52 y.o. male who is seen in consultation from Claiborne Rigg, NP for evaluation of low testosterone. He reported a recent history of decreased libido.  Free testosterone from 10/24: 4.0 No other labs done.   Past Medical History:  Past Medical History:  Diagnosis Date   Diabetes mellitus without complication (HCC)    Hyperlipidemia    Hypertension     Past Surgical History:  Past Surgical History:  Procedure Laterality Date   APPENDECTOMY     kidney stone removal      Allergies:  No Known Allergies  Family History:  Family History  Problem Relation Age of Onset   Hypertension Mother    Diabetes Father     Social History:  Social History   Tobacco Use   Smoking status: Never   Smokeless tobacco: Never  Vaping Use   Vaping status: Never Used  Substance Use Topics   Alcohol use: Not Currently    Comment: occasionally    Drug use: No    Review of symptoms:  Constitutional:  Negative for unexplained weight loss, night sweats, fever, chills ENT:  Negative for nose bleeds, sinus pain, painful swallowing CV:  Negative for chest pain, shortness of breath, exercise intolerance, palpitations, loss of consciousness Resp:  Negative for cough, wheezing, shortness of breath GI:  Negative for nausea, vomiting, diarrhea, bloody stools GU:  Positives noted in HPI; otherwise negative for gross hematuria, dysuria, urinary incontinence Neuro:  Negative for seizures, poor balance, limb weakness, slurred speech Psych:  Negative for lack of energy, depression, anxiety Endocrine:  Negative for polydipsia, polyuria, symptoms of hypoglycemia (dizziness, hunger, sweating) Hematologic:  Negative for anemia, purpura, petechia, prolonged or  excessive bleeding, use of anticoagulants  Allergic:  Negative for difficulty breathing or choking as a result of exposure to anything; no shellfish allergy; no allergic response (rash/itch) to materials, foods  Physical exam: There were no vitals taken for this visit. GENERAL APPEARANCE:  Well appearing, well developed, well nourished, NAD HEENT: Atraumatic, Normocephalic, oropharynx clear. NECK: Supple without lymphadenopathy or thyromegaly. LUNGS: Clear to auscultation bilaterally. HEART: Regular Rate and Rhythm without murmurs, gallops, or rubs. ABDOMEN: Soft, non-tender, No Masses. EXTREMITIES: Moves all extremities well.  Without clubbing, cyanosis, or edema. NEUROLOGIC:  Alert and oriented x 3, normal gait, CN II-XII grossly intact.  MENTAL STATUS:  Appropriate. BACK:  Non-tender to palpation.  No CVAT SKIN:  Warm, dry and intact.   GU: Penis:  {Exam; penis:5791} Meatus: {Meatus:15530} Scrotum: {pe scrotum:310183} Testis: {Exam; testicles:5790} Epididymis: {epididymis WUJW:119147} Prostate: {Exam; prostate:5793} Rectum: {rectal exam:26517}   Results: None

## 2023-01-20 ENCOUNTER — Other Ambulatory Visit: Payer: Self-pay

## 2023-01-25 ENCOUNTER — Other Ambulatory Visit: Payer: Self-pay

## 2023-02-15 ENCOUNTER — Other Ambulatory Visit: Payer: Self-pay

## 2023-02-24 ENCOUNTER — Other Ambulatory Visit: Payer: Self-pay

## 2023-02-24 ENCOUNTER — Ambulatory Visit: Payer: Self-pay | Attending: Nurse Practitioner | Admitting: Nurse Practitioner

## 2023-02-24 ENCOUNTER — Ambulatory Visit: Payer: Self-pay | Admitting: Nurse Practitioner

## 2023-02-24 ENCOUNTER — Encounter: Payer: Self-pay | Admitting: Nurse Practitioner

## 2023-02-24 VITALS — BP 108/69 | HR 95 | Ht 70.0 in | Wt 212.6 lb

## 2023-02-24 DIAGNOSIS — Z794 Long term (current) use of insulin: Secondary | ICD-10-CM

## 2023-02-24 DIAGNOSIS — I7300 Raynaud's syndrome without gangrene: Secondary | ICD-10-CM

## 2023-02-24 DIAGNOSIS — E1165 Type 2 diabetes mellitus with hyperglycemia: Secondary | ICD-10-CM

## 2023-02-24 LAB — POCT GLYCOSYLATED HEMOGLOBIN (HGB A1C): Hemoglobin A1C: 8.2 % — AB (ref 4.0–5.6)

## 2023-02-24 MED ORDER — GLUCOSE BLOOD VI STRP
ORAL_STRIP | 2 refills | Status: DC
  Filled 2023-02-24: qty 100, 50d supply, fill #0
  Filled 2023-04-16: qty 100, 50d supply, fill #1

## 2023-02-24 MED ORDER — TRUE METRIX METER W/DEVICE KIT
PACK | 0 refills | Status: AC
  Filled 2023-02-24: qty 1, 30d supply, fill #0

## 2023-02-24 MED ORDER — BASAGLAR KWIKPEN 100 UNIT/ML ~~LOC~~ SOPN
25.0000 [IU] | PEN_INJECTOR | Freq: Two times a day (BID) | SUBCUTANEOUS | 3 refills | Status: DC
  Filled 2023-02-24: qty 15, 30d supply, fill #0
  Filled 2023-03-23: qty 9, 18d supply, fill #1
  Filled 2023-04-13: qty 9, 18d supply, fill #2
  Filled 2023-04-26 (×2): qty 9, 18d supply, fill #3
  Filled 2023-05-25: qty 9, 18d supply, fill #4

## 2023-02-24 NOTE — Progress Notes (Signed)
 Assessment & Plan:  Lance Howell was seen today for medical management of chronic issues.  Diagnoses and all orders for this visit:  Uncontrolled type 2 diabetes mellitus with hyperglycemia, with long-term current use of insulin  (HCC) Increase basaglar  to 25 units BID  -     POCT glycosylated hemoglobin (Hb A1C) -     Insulin  Glargine (BASAGLAR  KWIKPEN) 100 UNIT/ML; Inject 25 Units into the skin 2 (two) times daily. -     glucose blood test strip; Use as instructed. Check blood glucose level by fingerstick twice per day. -     Blood Glucose Monitoring Suppl (TRUE METRIX METER) w/Device KIT; Use as instructed.  Raynaud's phenomenon without gangrene -     Thyroid  Panel With TSH -     Iron, TIBC and Ferritin Panel -     CBC with Differential  Low testosterone  Urology appt scheduled. Patient aware  Patient has been counseled on age-appropriate routine health concerns for screening and prevention. These are reviewed and up-to-date. Referrals have been placed accordingly. Immunizations are up-to-date or declined.    Subjective:   Chief Complaint  Patient presents with   Medical Management of Chronic Issues    Lance Howell 53 y.o. male presents to office today for follow up to DM and HTN   VRI was used to communicate directly with patient for the entire encounter including providing detailed patient instructions.     DM 2 A1c down from 9.1 to 8.2.  Still not at goal of less than 7.  He endorses adherence administering Basaglar  40 units daily, taking Farxiga  10 mg daily and metformin  1000 mg twice daily. Lab Results  Component Value Date   HGBA1C 8.2 (A) 02/24/2023     Reports feet feeling cold. This has been ongoing for several years. States his wife told him to discuss this today. Denies any circulatory complications such as discoloration of feet.    Endorses decreased sexual desire. Has history of decreased testosterone . He was referred to Urology however he was lost to  follow up with a cancellation and a no-show..   Review of Systems  Constitutional:  Negative for fever, malaise/fatigue and weight loss.  HENT: Negative.  Negative for nosebleeds.   Eyes: Negative.  Negative for blurred vision, double vision and photophobia.  Respiratory: Negative.  Negative for cough and shortness of breath.   Cardiovascular: Negative.  Negative for chest pain, palpitations and leg swelling.  Gastrointestinal: Negative.  Negative for heartburn, nausea and vomiting.  Genitourinary:        See HPI  Musculoskeletal: Negative.  Negative for myalgias.  Skin:        SEE HPI  Neurological: Negative.  Negative for dizziness, focal weakness, seizures and headaches.  Psychiatric/Behavioral: Negative.  Negative for suicidal ideas.     Past Medical History:  Diagnosis Date   Diabetes mellitus without complication (HCC)    Hyperlipidemia    Hypertension     Past Surgical History:  Procedure Laterality Date   APPENDECTOMY     kidney stone removal      Family History  Problem Relation Age of Onset   Hypertension Mother    Diabetes Father     Social History Reviewed with no changes to be made today.   Outpatient Medications Prior to Visit  Medication Sig Dispense Refill   amLODipine  (NORVASC ) 5 MG tablet Take 1 tablet (5 mg total) by mouth daily. 90 tablet 2   dapagliflozin  propanediol (FARXIGA ) 10 MG TABS tablet Take 1 tablet (  10 mg total) by mouth daily before breakfast. 30 tablet 2   diclofenac  Sodium (VOLTAREN ) 1 % GEL Apply 4 g topically 4 (four) times daily. 200 g 1   Insulin  Pen Needle (PEN NEEDLES) 32G X 4 MM MISC Use to inject Basaglar  once daily and Humalog  twice daily. 100 each 6   meloxicam  (MOBIC ) 7.5 MG tablet Take 1 tablet (7.5 mg total) by mouth daily for joint pain 90 tablet 1   metFORMIN  (GLUCOPHAGE ) 1000 MG tablet Take 1 tablet (1,000 mg total) by mouth 2 (two) times daily with a meal. 360 tablet 1   rosuvastatin  (CRESTOR ) 5 MG tablet Take 1 tablet  (5 mg total) by mouth daily. 90 tablet 1   Semaglutide , 2 MG/DOSE, 8 MG/3ML SOPN Inject 2 mg as directed once a week. 9 mL 1   traZODone  (DESYREL ) 100 MG tablet Take 1 tablet (100 mg total) by mouth at bedtime. 30 tablet 2   TRUEplus Lancets 28G MISC Use as instructed. Check blood glucose level by fingerstick twice per day. 100 each 2   valsartan -hydrochlorothiazide  (DIOVAN -HCT) 320-25 MG tablet Take 1 tablet by mouth daily. 90 tablet 3   Blood Glucose Monitoring Suppl (TRUE METRIX METER) w/Device KIT Use as instructed. 1 kit 0   glucose blood test strip Use as instructed. Check blood glucose level by fingerstick twice per day. 100 each 2   Insulin  Glargine (BASAGLAR  KWIKPEN) 100 UNIT/ML Inject 40 Units into the skin daily. 12 mL 2   butalbital -acetaminophen -caffeine  (FIORICET ) 50-325-40 MG tablet Take 1-2 tablets by mouth every 6 (six) hours as needed for headache. (Patient not taking: Reported on 02/24/2023) 30 tablet 1   No facility-administered medications prior to visit.    No Known Allergies     Objective:    BP 108/69 (BP Location: Left Arm, Patient Position: Sitting, Cuff Size: Normal)   Pulse 95   Ht 5\' 10"  (1.778 m)   Wt 212 lb 9.6 oz (96.4 kg)   SpO2 95%   BMI 30.50 kg/m  Wt Readings from Last 3 Encounters:  02/24/23 212 lb 9.6 oz (96.4 kg)  08/28/22 211 lb 12.8 oz (96.1 kg)  05/22/22 210 lb 9.6 oz (95.5 kg)    Physical Exam Vitals and nursing note reviewed.  Constitutional:      Appearance: He is well-developed.  HENT:     Head: Normocephalic and atraumatic.  Cardiovascular:     Rate and Rhythm: Normal rate and regular rhythm.     Heart sounds: Normal heart sounds. No murmur heard.    No friction rub. No gallop.  Pulmonary:     Effort: Pulmonary effort is normal. No tachypnea or respiratory distress.     Breath sounds: Normal breath sounds. No decreased breath sounds, wheezing, rhonchi or rales.  Chest:     Chest wall: No tenderness.  Abdominal:     General:  Bowel sounds are normal.     Palpations: Abdomen is soft.  Musculoskeletal:        General: Normal range of motion.     Cervical back: Normal range of motion.  Skin:    General: Skin is warm and dry.  Neurological:     Mental Status: He is alert and oriented to person, place, and time.     Coordination: Coordination normal.  Psychiatric:        Behavior: Behavior normal. Behavior is cooperative.        Thought Content: Thought content normal.        Judgment:  Judgment normal.          Patient has been counseled extensively about nutrition and exercise as well as the importance of adherence with medications and regular follow-up. The patient was given clear instructions to go to ER or return to medical center if symptoms don't improve, worsen or new problems develop. The patient verbalized understanding.   Follow-up: Return in about 3 months (around 05/25/2023).   Collins Dean, FNP-BC [facility] [place], [place] [phone]   02/24/2023, 5:02 PM

## 2023-02-25 LAB — CBC WITH DIFFERENTIAL/PLATELET
Basophils Absolute: 0.1 10*3/uL (ref 0.0–0.2)
Basos: 1 %
EOS (ABSOLUTE): 0 10*3/uL (ref 0.0–0.4)
Eos: 0 %
Hematocrit: 46.8 % (ref 37.5–51.0)
Hemoglobin: 15 g/dL (ref 13.0–17.7)
Immature Grans (Abs): 0 10*3/uL (ref 0.0–0.1)
Immature Granulocytes: 0 %
Lymphocytes Absolute: 2.3 10*3/uL (ref 0.7–3.1)
Lymphs: 25 %
MCH: 28.5 pg (ref 26.6–33.0)
MCHC: 32.1 g/dL (ref 31.5–35.7)
MCV: 89 fL (ref 79–97)
Monocytes Absolute: 0.6 10*3/uL (ref 0.1–0.9)
Monocytes: 6 %
Neutrophils Absolute: 6.1 10*3/uL (ref 1.4–7.0)
Neutrophils: 68 %
Platelets: 291 10*3/uL (ref 150–450)
RBC: 5.26 x10E6/uL (ref 4.14–5.80)
RDW: 12.7 % (ref 11.6–15.4)
WBC: 9.1 10*3/uL (ref 3.4–10.8)

## 2023-02-25 LAB — THYROID PANEL WITH TSH
Free Thyroxine Index: 2.1 (ref 1.2–4.9)
T3 Uptake Ratio: 25 % (ref 24–39)
T4, Total: 8.4 ug/dL (ref 4.5–12.0)
TSH: 1.1 u[IU]/mL (ref 0.450–4.500)

## 2023-02-25 LAB — IRON,TIBC AND FERRITIN PANEL
Ferritin: 182 ng/mL (ref 30–400)
Iron Saturation: 15 % (ref 15–55)
Iron: 56 ug/dL (ref 38–169)
Total Iron Binding Capacity: 363 ug/dL (ref 250–450)
UIBC: 307 ug/dL (ref 111–343)

## 2023-03-05 ENCOUNTER — Ambulatory Visit: Payer: Self-pay | Admitting: Nurse Practitioner

## 2023-03-08 ENCOUNTER — Encounter: Payer: Self-pay | Admitting: Nurse Practitioner

## 2023-03-08 ENCOUNTER — Other Ambulatory Visit: Payer: Self-pay

## 2023-03-17 ENCOUNTER — Other Ambulatory Visit: Payer: Self-pay

## 2023-03-18 ENCOUNTER — Other Ambulatory Visit: Payer: Self-pay

## 2023-03-22 ENCOUNTER — Ambulatory Visit: Payer: Self-pay | Admitting: Urology

## 2023-03-22 ENCOUNTER — Encounter: Payer: Self-pay | Admitting: Urology

## 2023-03-22 VITALS — BP 117/82 | HR 94 | Ht 69.0 in | Wt 211.0 lb

## 2023-03-22 DIAGNOSIS — R7989 Other specified abnormal findings of blood chemistry: Secondary | ICD-10-CM

## 2023-03-22 NOTE — Progress Notes (Signed)
 Assessment: 1. Low testosterone      Plan: I personally reviewed the patient's chart including provider notes, and lab results. Diagnosis and management of low testosterone  discussed with the patient. He currently does not feel like he has any symptoms that warrant further evaluation. Should he develop symptoms, recommend further evaluation with additional laboratory testing with AM labs including total testosterone , LH, prolactin, and PSA.   Return to office prn   Chief Complaint:  Chief Complaint  Patient presents with   Low Testosterone     History of Present Illness:  Lance Howell is a 53 y.o. male who is seen in consultation from Collins Dean, NP for evaluation of low testosterone .   He states that his wife was concerned about his decreased sex drive.  He mentioned this to his PCP at his visit in October 2024.  He had a free testosterone  done as part of his routine laboratory testing. Free testosterone  from 10/24:  4.0 He denies any decrease in his libido.  No problems with erections.  He denies any fatigue or lack of energy.  He does not have any loss of muscle mass or change in his physical performance.  He does not feel that he needs additional evaluation for any symptoms of a low testosterone  at this time.  He does have nocturia 3-4 times which she associates with his treatment for diabetes.  No dysuria or gross hematuria. IPSS = 8 today.   Past Medical History:  Past Medical History:  Diagnosis Date   Diabetes mellitus without complication (HCC)    Hyperlipidemia    Hypertension     Past Surgical History:  Past Surgical History:  Procedure Laterality Date   APPENDECTOMY     kidney stone removal      Allergies:  No Known Allergies  Family History:  Family History  Problem Relation Age of Onset   Hypertension Mother    Diabetes Father     Social History:  Social History   Tobacco Use   Smoking status: Never   Smokeless tobacco: Never   Vaping Use   Vaping status: Never Used  Substance Use Topics   Alcohol use: Not Currently    Comment: occasionally    Drug use: No    Review of symptoms:  Constitutional:  Negative for unexplained weight loss, night sweats, fever, chills ENT:  Negative for nose bleeds, sinus pain, painful swallowing CV:  Negative for chest pain, shortness of breath, exercise intolerance, palpitations, loss of consciousness Resp:  Negative for cough, wheezing, shortness of breath GI:  Negative for nausea, vomiting, diarrhea, bloody stools GU:  Positives noted in HPI; otherwise negative for gross hematuria, dysuria, urinary incontinence Neuro:  Negative for seizures, poor balance, limb weakness, slurred speech Psych:  Negative for lack of energy, depression, anxiety Endocrine:  Negative for polydipsia, polyuria, symptoms of hypoglycemia (dizziness, hunger, sweating) Hematologic:  Negative for anemia, purpura, petechia, prolonged or excessive bleeding, use of anticoagulants  Allergic:  Negative for difficulty breathing or choking as a result of exposure to anything; no shellfish allergy; no allergic response (rash/itch) to materials, foods  Physical exam: BP 117/82   Pulse 94   Ht 5\' 9"  (1.753 m)   Wt 211 lb (95.7 kg)   BMI 31.16 kg/m  GENERAL APPEARANCE:  Well appearing, well developed, well nourished, NAD HEENT:  Atraumatic, normocephalic, oropharynx clear NECK:  Supple without lymphadenopathy or thyromegaly ABDOMEN:  Soft, non-tender, no masses EXTREMITIES:  Moves all extremities well, without clubbing, cyanosis, or edema  NEUROLOGIC:  Alert and oriented x 3, normal gait, CN II-XII grossly intact MENTAL STATUS:  appropriate BACK:  Non-tender to palpation, No CVAT SKIN:  Warm, dry, and intact  Results: None

## 2023-03-23 ENCOUNTER — Other Ambulatory Visit: Payer: Self-pay

## 2023-04-06 ENCOUNTER — Other Ambulatory Visit: Payer: Self-pay

## 2023-04-13 ENCOUNTER — Other Ambulatory Visit: Payer: Self-pay

## 2023-04-16 ENCOUNTER — Other Ambulatory Visit: Payer: Self-pay

## 2023-04-19 ENCOUNTER — Other Ambulatory Visit: Payer: Self-pay

## 2023-04-23 ENCOUNTER — Other Ambulatory Visit: Payer: Self-pay

## 2023-04-26 ENCOUNTER — Other Ambulatory Visit: Payer: Self-pay | Admitting: Nurse Practitioner

## 2023-04-26 ENCOUNTER — Other Ambulatory Visit: Payer: Self-pay

## 2023-04-27 ENCOUNTER — Other Ambulatory Visit: Payer: Self-pay

## 2023-04-27 MED ORDER — OZEMPIC (2 MG/DOSE) 8 MG/3ML ~~LOC~~ SOPN
2.0000 mg | PEN_INJECTOR | SUBCUTANEOUS | 1 refills | Status: DC
  Filled 2023-04-27: qty 9, 84d supply, fill #0
  Filled 2023-05-25 (×2): qty 3, 28d supply, fill #0
  Filled 2023-06-14: qty 3, 28d supply, fill #1
  Filled 2023-07-13: qty 6, 56d supply, fill #2
  Filled 2023-09-07: qty 3, 28d supply, fill #3

## 2023-05-17 ENCOUNTER — Other Ambulatory Visit: Payer: Self-pay

## 2023-05-25 ENCOUNTER — Other Ambulatory Visit: Payer: Self-pay

## 2023-05-26 ENCOUNTER — Other Ambulatory Visit: Payer: Self-pay

## 2023-05-27 ENCOUNTER — Other Ambulatory Visit: Payer: Self-pay

## 2023-05-28 ENCOUNTER — Ambulatory Visit: Payer: Self-pay | Admitting: Nurse Practitioner

## 2023-05-28 ENCOUNTER — Other Ambulatory Visit: Payer: Self-pay

## 2023-05-31 ENCOUNTER — Encounter: Payer: Self-pay | Admitting: Nurse Practitioner

## 2023-05-31 ENCOUNTER — Ambulatory Visit: Payer: Self-pay | Attending: Nurse Practitioner | Admitting: Nurse Practitioner

## 2023-05-31 ENCOUNTER — Other Ambulatory Visit: Payer: Self-pay

## 2023-05-31 VITALS — BP 116/81 | HR 70 | Resp 18 | Ht 69.0 in | Wt 211.8 lb

## 2023-05-31 DIAGNOSIS — J302 Other seasonal allergic rhinitis: Secondary | ICD-10-CM

## 2023-05-31 DIAGNOSIS — K219 Gastro-esophageal reflux disease without esophagitis: Secondary | ICD-10-CM

## 2023-05-31 DIAGNOSIS — E785 Hyperlipidemia, unspecified: Secondary | ICD-10-CM

## 2023-05-31 DIAGNOSIS — I10 Essential (primary) hypertension: Secondary | ICD-10-CM

## 2023-05-31 DIAGNOSIS — E1165 Type 2 diabetes mellitus with hyperglycemia: Secondary | ICD-10-CM

## 2023-05-31 DIAGNOSIS — Z794 Long term (current) use of insulin: Secondary | ICD-10-CM

## 2023-05-31 LAB — POCT GLYCOSYLATED HEMOGLOBIN (HGB A1C): Hemoglobin A1C: 7.2 % — AB (ref 4.0–5.6)

## 2023-05-31 MED ORDER — CETIRIZINE HCL 10 MG PO TABS
10.0000 mg | ORAL_TABLET | Freq: Every day | ORAL | 1 refills | Status: AC
Start: 2023-05-31 — End: ?
  Filled 2023-05-31: qty 90, 90d supply, fill #0

## 2023-05-31 MED ORDER — FAMOTIDINE 40 MG PO TABS
40.0000 mg | ORAL_TABLET | Freq: Every day | ORAL | 1 refills | Status: AC
  Filled 2023-05-31: qty 90, 90d supply, fill #0

## 2023-05-31 MED ORDER — BASAGLAR KWIKPEN 100 UNIT/ML ~~LOC~~ SOPN
25.0000 [IU] | PEN_INJECTOR | Freq: Two times a day (BID) | SUBCUTANEOUS | 3 refills | Status: DC
  Filled 2023-05-31 – 2023-06-14 (×2): qty 15, 30d supply, fill #0
  Filled 2023-07-20 (×2): qty 15, 30d supply, fill #1
  Filled 2023-08-19: qty 15, 30d supply, fill #2
  Filled 2023-09-16: qty 15, 30d supply, fill #3

## 2023-05-31 MED ORDER — AMLODIPINE BESYLATE 5 MG PO TABS
5.0000 mg | ORAL_TABLET | Freq: Every day | ORAL | 2 refills | Status: DC
  Filled 2023-05-31 – 2023-06-21 (×2): qty 90, 90d supply, fill #0
  Filled 2023-09-16: qty 90, 90d supply, fill #1
  Filled 2023-12-16: qty 90, 90d supply, fill #2

## 2023-05-31 MED ORDER — ROSUVASTATIN CALCIUM 5 MG PO TABS
5.0000 mg | ORAL_TABLET | Freq: Every day | ORAL | 1 refills | Status: DC
  Filled 2023-05-31 (×2): qty 90, 90d supply, fill #0
  Filled 2023-08-16: qty 90, 90d supply, fill #1

## 2023-05-31 NOTE — Progress Notes (Signed)
 Assessment & Plan:  Lance Howell was seen today for medical management of chronic issues.  Diagnoses and all orders for this visit:  Uncontrolled type 2 diabetes mellitus with hyperglycemia, with long-term current use of insulin  (HCC) -     POCT glycosylated hemoglobin (Hb A1C) -     Insulin  Glargine (BASAGLAR  KWIKPEN) 100 UNIT/ML; Inject 25 Units into the skin 2 (two) times daily. -     CMP14+EGFR  Primary hypertension -     amLODipine  (NORVASC ) 5 MG tablet; Take 1 tablet (5 mg total) by mouth daily. -     CMP14+EGFR  Dyslipidemia, goal LDL below 70 -     rosuvastatin  (CRESTOR ) 5 MG tablet; Take 1 tablet (5 mg total) by mouth daily.  GERD without esophagitis -     famotidine  (PEPCID ) 40 MG tablet; Take 1 tablet (40 mg total) by mouth daily.  Seasonal allergies -     cetirizine  (ZYRTEC ) 10 MG tablet; Take 1 tablet (10 mg total) by mouth daily. FOR ALLERGIES    Patient has been counseled on age-appropriate routine health concerns for screening and prevention. These are reviewed and up-to-date. Referrals have been placed accordingly. Immunizations are up-to-date or declined.    Subjective:   Chief Complaint  Patient presents with   Medical Management of Chronic Issues    Lance Howell 53 y.o. male presents to office today for follow up to DM.   has a past medical history of Diabetes mellitus without complication (HCC), Hyperlipidemia, and Hypertension.   VRI was used to communicate directly with patient for the entire encounter including providing detailed patient instructions.     DM 2 A1c improved and nearing goal of <7.0  Down from 8.2 to 7.2 He is currently prescribed Farxiga  10 mg daily, Basaglar  25 units twice daily, metformin  1000 mg twice daily, and Ozempic  2 mg weekly.  He does note increased burning in stomach with certain spicy foods.  Could possibly be related to Ozempic  however we will treat with Pepcid  today. Lab Results  Component Value Date   HGBA1C 7.2 (A)  05/31/2023     HTN Blood pressure is well controlled.  He is currently prescribed amlodipine  5 mg daily and valsartan  320-25 mg daily BP Readings from Last 3 Encounters:  05/31/23 116/81  03/22/23 117/82  02/24/23 108/69        Review of Systems  Constitutional:  Negative for fever, malaise/fatigue and weight loss.  HENT: Negative.  Negative for nosebleeds.   Eyes: Negative.  Negative for blurred vision, double vision and photophobia.  Respiratory: Negative.  Negative for cough and shortness of breath.   Cardiovascular: Negative.  Negative for chest pain, palpitations and leg swelling.  Gastrointestinal:  Positive for abdominal pain and heartburn. Negative for blood in stool, constipation, diarrhea, melena, nausea and vomiting.  Musculoskeletal: Negative.  Negative for myalgias.  Neurological: Negative.  Negative for dizziness, focal weakness, seizures and headaches.  Endo/Heme/Allergies:  Positive for environmental allergies.  Psychiatric/Behavioral: Negative.  Negative for suicidal ideas.     Past Medical History:  Diagnosis Date   Diabetes mellitus without complication (HCC)    Hyperlipidemia    Hypertension     Past Surgical History:  Procedure Laterality Date   APPENDECTOMY     kidney stone removal      Family History  Problem Relation Age of Onset   Hypertension Mother    Diabetes Father     Social History Reviewed with no changes to be made today.   Outpatient  Medications Prior to Visit  Medication Sig Dispense Refill   Blood Glucose Monitoring Suppl (TRUE METRIX METER) w/Device KIT Use as instructed. 1 kit 0   dapagliflozin  propanediol (FARXIGA ) 10 MG TABS tablet Take 1 tablet (10 mg total) by mouth daily before breakfast. 30 tablet 2   glucose blood test strip Use as instructed. Check blood glucose level by fingerstick twice per day. 100 each 2   metFORMIN  (GLUCOPHAGE ) 1000 MG tablet Take 1 tablet (1,000 mg total) by mouth 2 (two) times daily with a meal.  360 tablet 1   Semaglutide , 2 MG/DOSE, (OZEMPIC , 2 MG/DOSE,) 8 MG/3ML SOPN Inject 2 mg as directed once a week. 9 mL 1   TRUEplus Lancets 28G MISC Use as instructed. Check blood glucose level by fingerstick twice per day. 100 each 2   valsartan -hydrochlorothiazide  (DIOVAN -HCT) 320-25 MG tablet Take 1 tablet by mouth daily. 90 tablet 3   amLODipine  (NORVASC ) 5 MG tablet Take 1 tablet (5 mg total) by mouth daily. 90 tablet 2   Insulin  Glargine (BASAGLAR  KWIKPEN) 100 UNIT/ML Inject 25 Units into the skin 2 (two) times daily. 15 mL 3   rosuvastatin  (CRESTOR ) 5 MG tablet Take 1 tablet (5 mg total) by mouth daily. 90 tablet 1   Insulin  Pen Needle (PEN NEEDLES) 32G X 4 MM MISC Use to inject Basaglar  once daily and Humalog  twice daily. (Patient not taking: Reported on 05/31/2023) 100 each 6   butalbital -acetaminophen -caffeine  (FIORICET ) 50-325-40 MG tablet Take 1-2 tablets by mouth every 6 (six) hours as needed for headache. (Patient not taking: Reported on 05/31/2023) 30 tablet 1   diclofenac  Sodium (VOLTAREN ) 1 % GEL Apply 4 g topically 4 (four) times daily. (Patient not taking: Reported on 05/31/2023) 200 g 1   meloxicam  (MOBIC ) 7.5 MG tablet Take 1 tablet (7.5 mg total) by mouth daily for joint pain (Patient not taking: Reported on 05/31/2023) 90 tablet 1   traZODone  (DESYREL ) 100 MG tablet Take 1 tablet (100 mg total) by mouth at bedtime. (Patient not taking: Reported on 05/31/2023) 30 tablet 2   No facility-administered medications prior to visit.    No Known Allergies     Objective:    BP 116/81 (BP Location: Left Arm, Patient Position: Sitting, Cuff Size: Normal)   Pulse 70   Resp 18   Ht 5\' 9"  (1.753 m)   Wt 211 lb 12.8 oz (96.1 kg)   SpO2 100%   BMI 31.28 kg/m  Wt Readings from Last 3 Encounters:  05/31/23 211 lb 12.8 oz (96.1 kg)  03/22/23 211 lb (95.7 kg)  02/24/23 212 lb 9.6 oz (96.4 kg)    Physical Exam Vitals and nursing note reviewed.  Constitutional:      Appearance: He is  well-developed.  HENT:     Head: Normocephalic and atraumatic.  Cardiovascular:     Rate and Rhythm: Normal rate and regular rhythm.     Heart sounds: Normal heart sounds. No murmur heard.    No friction rub. No gallop.  Pulmonary:     Effort: Pulmonary effort is normal. No tachypnea or respiratory distress.     Breath sounds: Normal breath sounds. No decreased breath sounds, wheezing, rhonchi or rales.  Chest:     Chest wall: No tenderness.  Abdominal:     General: Bowel sounds are normal.     Palpations: Abdomen is soft.  Musculoskeletal:        General: Normal range of motion.     Cervical back: Normal range of  motion.  Skin:    General: Skin is warm and dry.  Neurological:     Mental Status: He is alert and oriented to person, place, and time.     Coordination: Coordination normal.  Psychiatric:        Behavior: Behavior normal. Behavior is cooperative.        Thought Content: Thought content normal.        Judgment: Judgment normal.          Patient has been counseled extensively about nutrition and exercise as well as the importance of adherence with medications and regular follow-up. The patient was given clear instructions to go to ER or return to medical center if symptoms don't improve, worsen or new problems develop. The patient verbalized understanding.   Follow-up: Return in about 3 months (around 08/30/2023).   Collins Dean, FNP-BC [facility] and [facility] [place], [place] [phone]   05/31/2023, 9:38 AM

## 2023-06-01 LAB — CMP14+EGFR
ALT: 26 IU/L (ref 0–44)
AST: 16 IU/L (ref 0–40)
Albumin: 4.5 g/dL (ref 3.8–4.9)
Alkaline Phosphatase: 81 IU/L (ref 44–121)
BUN/Creatinine Ratio: 18 (ref 9–20)
BUN: 13 mg/dL (ref 6–24)
Bilirubin Total: 0.3 mg/dL (ref 0.0–1.2)
CO2: 22 mmol/L (ref 20–29)
Calcium: 9.6 mg/dL (ref 8.7–10.2)
Chloride: 103 mmol/L (ref 96–106)
Creatinine, Ser: 0.74 mg/dL — ABNORMAL LOW (ref 0.76–1.27)
Globulin, Total: 2.4 g/dL (ref 1.5–4.5)
Glucose: 99 mg/dL (ref 70–99)
Potassium: 4.2 mmol/L (ref 3.5–5.2)
Sodium: 141 mmol/L (ref 134–144)
Total Protein: 6.9 g/dL (ref 6.0–8.5)
eGFR: 110 mL/min/{1.73_m2} (ref >59)

## 2023-06-03 ENCOUNTER — Encounter: Payer: Self-pay | Admitting: Nurse Practitioner

## 2023-06-14 ENCOUNTER — Other Ambulatory Visit: Payer: Self-pay

## 2023-06-21 ENCOUNTER — Other Ambulatory Visit: Payer: Self-pay

## 2023-06-22 ENCOUNTER — Other Ambulatory Visit: Payer: Self-pay

## 2023-07-13 ENCOUNTER — Other Ambulatory Visit: Payer: Self-pay

## 2023-07-20 ENCOUNTER — Other Ambulatory Visit: Payer: Self-pay

## 2023-08-09 ENCOUNTER — Other Ambulatory Visit: Payer: Self-pay

## 2023-08-16 ENCOUNTER — Other Ambulatory Visit: Payer: Self-pay

## 2023-08-19 ENCOUNTER — Other Ambulatory Visit: Payer: Self-pay

## 2023-08-20 ENCOUNTER — Other Ambulatory Visit: Payer: Self-pay

## 2023-08-23 ENCOUNTER — Other Ambulatory Visit: Payer: Self-pay | Admitting: Nurse Practitioner

## 2023-08-23 ENCOUNTER — Other Ambulatory Visit: Payer: Self-pay

## 2023-08-23 ENCOUNTER — Other Ambulatory Visit (HOSPITAL_COMMUNITY): Payer: Self-pay

## 2023-08-23 DIAGNOSIS — E785 Hyperlipidemia, unspecified: Secondary | ICD-10-CM

## 2023-08-24 ENCOUNTER — Other Ambulatory Visit: Payer: Self-pay

## 2023-08-25 ENCOUNTER — Other Ambulatory Visit: Payer: Self-pay

## 2023-08-26 ENCOUNTER — Other Ambulatory Visit: Payer: Self-pay

## 2023-08-26 ENCOUNTER — Other Ambulatory Visit: Payer: Self-pay | Admitting: Nurse Practitioner

## 2023-08-26 DIAGNOSIS — E785 Hyperlipidemia, unspecified: Secondary | ICD-10-CM

## 2023-08-27 ENCOUNTER — Other Ambulatory Visit: Payer: Self-pay | Admitting: Nurse Practitioner

## 2023-08-27 ENCOUNTER — Telehealth: Payer: Self-pay | Admitting: Nurse Practitioner

## 2023-08-27 DIAGNOSIS — E785 Hyperlipidemia, unspecified: Secondary | ICD-10-CM

## 2023-08-27 NOTE — Telephone Encounter (Signed)
 Contacted left vm to confirmed appt

## 2023-08-27 NOTE — Telephone Encounter (Signed)
 Copied from CRM [id number]. Topic: Clinical - Prescription Issue >> Aug 27, 2023  2:02 PM Jasmin G wrote: Reason for CRM: Pt. Daughter informed me they were told that PCP needs to give permission to pharmacy to refill rosuvastatin  (CRESTOR ) 5 MG tablet. Please call pt. If questions arise.

## 2023-08-27 NOTE — Telephone Encounter (Signed)
 Contacted pt left vm to confirmed appt (inter ID [id number]  couldn't find the office info)

## 2023-08-30 ENCOUNTER — Ambulatory Visit: Payer: Self-pay | Admitting: Nurse Practitioner

## 2023-08-30 NOTE — Telephone Encounter (Signed)
 Duplicate request.  Requested Prescriptions  Pending Prescriptions Disp Refills   rosuvastatin  (CRESTOR ) 5 MG tablet 90 tablet 1    Sig: Take 1 tablet (5 mg total) by mouth daily.     Cardiovascular:  Antilipid - Statins 2 Failed - 08/30/2023  2:24 PM      Failed - Cr in normal range and within 360 days    Creatinine, Ser  Date Value Ref Range Status  05/31/2023 0.74 (L) 0.76 - 1.27 mg/dL Final         Failed - Lipid Panel in normal range within the last 12 months    Cholesterol, Total  Date Value Ref Range Status  02/17/2022 120 100 - 199 mg/dL Final   LDL Chol Calc (NIH)  Date Value Ref Range Status  02/17/2022 52 0 - 99 mg/dL Final   HDL  Date Value Ref Range Status  02/17/2022 50 >39 mg/dL Final   Triglycerides  Date Value Ref Range Status  02/17/2022 93 0 - 149 mg/dL Final         Passed - Patient is not pregnant      Passed - Valid encounter within last 12 months    Recent Outpatient Visits           3 months ago Uncontrolled type 2 diabetes mellitus with hyperglycemia, with long-term current use of insulin  (HCC)   [facility] [place]. [facility] [place], [place] W, NP   6 months ago Uncontrolled type 2 diabetes mellitus with hyperglycemia, with long-term current use of insulin  [facility])   [facility] [place]. [facility], [place] W, NP   9 months ago Uncontrolled type 2 diabetes mellitus with hyperglycemia, with long-term current use of insulin  [facility])   [facility] [place]. [facility] Morris, [place] L, RPH-CPP   10 months ago Uncontrolled type 2 diabetes mellitus with hyperglycemia, with long-term current use of insulin  [facility])   [facility] Of [place]. [facility] Morris, [place] L, RPH-CPP   1 year ago Uncontrolled type 2 diabetes mellitus with hyperglycemia, with long-term current use  of insulin  [facility])   [facility] A Dept Of . [facility] Theotis Haze ORN, [place]

## 2023-09-03 ENCOUNTER — Other Ambulatory Visit: Payer: Self-pay

## 2023-09-06 ENCOUNTER — Other Ambulatory Visit: Payer: Self-pay

## 2023-09-07 ENCOUNTER — Other Ambulatory Visit: Payer: Self-pay

## 2023-09-13 ENCOUNTER — Other Ambulatory Visit: Payer: Self-pay

## 2023-09-16 ENCOUNTER — Other Ambulatory Visit: Payer: Self-pay

## 2023-09-28 ENCOUNTER — Telehealth: Payer: Self-pay | Admitting: Nurse Practitioner

## 2023-09-28 NOTE — Telephone Encounter (Signed)
 Pt unconfirmed appt 8/19 lvm

## 2023-09-29 ENCOUNTER — Ambulatory Visit: Payer: Self-pay | Attending: Nurse Practitioner | Admitting: Nurse Practitioner

## 2023-09-29 ENCOUNTER — Encounter: Payer: Self-pay | Admitting: Nurse Practitioner

## 2023-09-29 ENCOUNTER — Other Ambulatory Visit: Payer: Self-pay

## 2023-09-29 VITALS — BP 110/76 | HR 69 | Resp 19 | Ht 69.0 in | Wt 206.4 lb

## 2023-09-29 DIAGNOSIS — M25561 Pain in right knee: Secondary | ICD-10-CM

## 2023-09-29 DIAGNOSIS — E785 Hyperlipidemia, unspecified: Secondary | ICD-10-CM

## 2023-09-29 DIAGNOSIS — I10 Essential (primary) hypertension: Secondary | ICD-10-CM

## 2023-09-29 DIAGNOSIS — Z7984 Long term (current) use of oral hypoglycemic drugs: Secondary | ICD-10-CM

## 2023-09-29 DIAGNOSIS — G8929 Other chronic pain: Secondary | ICD-10-CM

## 2023-09-29 DIAGNOSIS — Z794 Long term (current) use of insulin: Secondary | ICD-10-CM

## 2023-09-29 DIAGNOSIS — E119 Type 2 diabetes mellitus without complications: Secondary | ICD-10-CM

## 2023-09-29 DIAGNOSIS — E1165 Type 2 diabetes mellitus with hyperglycemia: Secondary | ICD-10-CM

## 2023-09-29 DIAGNOSIS — E118 Type 2 diabetes mellitus with unspecified complications: Secondary | ICD-10-CM

## 2023-09-29 LAB — POCT GLYCOSYLATED HEMOGLOBIN (HGB A1C): HbA1c, POC (controlled diabetic range): 6.7 % (ref 0.0–7.0)

## 2023-09-29 MED ORDER — MELOXICAM 7.5 MG PO TABS
7.5000 mg | ORAL_TABLET | Freq: Every day | ORAL | 0 refills | Status: AC
  Filled 2023-09-29: qty 60, 30d supply, fill #0

## 2023-09-29 MED ORDER — METFORMIN HCL 1000 MG PO TABS
1000.0000 mg | ORAL_TABLET | Freq: Two times a day (BID) | ORAL | 1 refills | Status: AC
  Filled 2023-09-29: qty 360, 180d supply, fill #0

## 2023-09-29 MED ORDER — BASAGLAR KWIKPEN 100 UNIT/ML ~~LOC~~ SOPN
25.0000 [IU] | PEN_INJECTOR | Freq: Two times a day (BID) | SUBCUTANEOUS | 3 refills | Status: DC
  Filled 2023-09-29 – 2023-10-25 (×2): qty 15, 30d supply, fill #0
  Filled 2023-12-02 (×2): qty 15, 30d supply, fill #1
  Filled 2024-01-03: qty 15, 30d supply, fill #2
  Filled 2024-02-02: qty 15, 30d supply, fill #3

## 2023-09-29 MED ORDER — OZEMPIC (2 MG/DOSE) 8 MG/3ML ~~LOC~~ SOPN
2.0000 mg | PEN_INJECTOR | SUBCUTANEOUS | 1 refills | Status: AC
  Filled 2023-09-29: qty 3, 28d supply, fill #0
  Filled 2023-11-08 (×2): qty 3, 28d supply, fill #1
  Filled 2023-12-02 (×3): qty 3, 28d supply, fill #2
  Filled 2024-01-03: qty 3, 28d supply, fill #3
  Filled 2024-01-31: qty 3, 28d supply, fill #4
  Filled 2024-03-01: qty 3, 28d supply, fill #5

## 2023-09-29 MED ORDER — GLUCOSE BLOOD VI STRP
ORAL_STRIP | 2 refills | Status: DC
  Filled 2023-09-29: qty 100, 50d supply, fill #0

## 2023-09-29 MED ORDER — ROSUVASTATIN CALCIUM 5 MG PO TABS
5.0000 mg | ORAL_TABLET | Freq: Every day | ORAL | 1 refills | Status: AC
  Filled 2023-09-29 – 2023-11-23 (×2): qty 90, 90d supply, fill #0
  Filled 2024-02-24 (×2): qty 90, 90d supply, fill #1

## 2023-09-29 NOTE — Progress Notes (Addendum)
 Assessment & Plan:  Lance Howell was seen today for diabetes.  Diagnoses and all orders for this visit:  Diabetes mellitus treated with insulin  and oral medication (HCC) -     POCT glycosylated hemoglobin (Hb A1C) -     Insulin  Glargine (BASAGLAR  KWIKPEN) 100 UNIT/ML; Inject 25 Units into the skin 2 (two) times daily. -     metFORMIN  (GLUCOPHAGE ) 1000 MG tablet; Take 1 tablet (1,000 mg total) by mouth 2 (two) times daily with a meal. -     Semaglutide , 2 MG/DOSE, (OZEMPIC , 2 MG/DOSE,) 8 MG/3ML SOPN; Inject 2 mg as directed once a week. -     glucose blood test strip; Use as instructed. Check blood glucose level by fingerstick twice per day. -     CMP14+EGFR  Dyslipidemia, goal LDL below 70 LDL at goal -     rosuvastatin  (CRESTOR ) 5 MG tablet; Take 1 tablet (5 mg total) by mouth daily. Lab Results  Component Value Date   LDLCALC 52 02/17/2022     Chronic pain of right knee -     meloxicam  (MOBIC ) 7.5 MG tablet; Take 1-2 tablets (7.5-15 mg total) by mouth daily. -     DG Knee Complete 4 Views Right; Future  Primary hypertension Continue amlodipine  and Diovan  HCT as prescribed.  Reminded to bring in blood pressure log for follow  up appointment.  RECOMMENDATIONS: DASH/Mediterranean Diets are healthier choices for HTN.     Patient has been counseled on age-appropriate routine health concerns for screening and prevention. These are reviewed and up-to-date. Referrals have been placed accordingly. Immunizations are up-to-date or declined.    Subjective:   Chief Complaint  Patient presents with   Diabetes    Lance Howell 53 y.o. male presents to office today for follow-up to hypertension and diabetes.  HTN Blood pressure is well-controlled with valsartan  320-25 mg daily and amlodipine  5 mg daily BP Readings from Last 3 Encounters:  09/29/23 110/76  05/31/23 116/81  03/22/23 117/82     DM 2 A1c has significantly improved today and currently 6.7 down from 7.2 He is adherent  with taking Farxiga10 mg daily, metformin , administering Basaglar  25 units twice daily and Ozempic  2 mg weekly. Lab Results  Component Value Date   HGBA1C 6.7 09/29/2023    He has chronic right knee pain unrelated to any injury or trauma.  Pain is worse with weightbearing or prolonged walking. Review of Systems  Constitutional:  Negative for fever, malaise/fatigue and weight loss.  HENT: Negative.  Negative for nosebleeds.   Eyes: Negative.  Negative for blurred vision, double vision and photophobia.  Respiratory: Negative.  Negative for cough and shortness of breath.   Cardiovascular: Negative.  Negative for chest pain, palpitations and leg swelling.  Gastrointestinal: Negative.  Negative for heartburn, nausea and vomiting.  Musculoskeletal:  Positive for joint pain. Negative for myalgias.  Neurological: Negative.  Negative for dizziness, focal weakness, seizures and headaches.  Psychiatric/Behavioral: Negative.  Negative for suicidal ideas.     Past Medical History:  Diagnosis Date   Diabetes mellitus without complication (HCC)    Hyperlipidemia    Hypertension     Past Surgical History:  Procedure Laterality Date   APPENDECTOMY     kidney stone removal      Family History  Problem Relation Age of Onset   Hypertension Mother    Diabetes Father     Social History Reviewed with no changes to be made today.   Outpatient Medications Prior to Visit  Medication Sig Dispense Refill   amLODipine  (NORVASC ) 5 MG tablet Take 1 tablet (5 mg total) by mouth daily. 90 tablet 2   Blood Glucose Monitoring Suppl (TRUE METRIX METER) w/Device KIT Use as instructed. 1 kit 0   cetirizine  (ZYRTEC ) 10 MG tablet Take 1 tablet (10 mg total) by mouth daily. FOR ALLERGIES 90 tablet 1   dapagliflozin  propanediol (FARXIGA ) 10 MG TABS tablet Take 1 tablet (10 mg total) by mouth daily before breakfast. 30 tablet 2   famotidine  (PEPCID ) 40 MG tablet Take 1 tablet (40 mg total) by mouth daily. 90  tablet 1   Insulin  Pen Needle (PEN NEEDLES) 32G X 4 MM MISC Use to inject Basaglar  once daily and Humalog  twice daily. 100 each 6   TRUEplus Lancets 28G MISC Use as instructed. Check blood glucose level by fingerstick twice per day. 100 each 2   valsartan -hydrochlorothiazide  (DIOVAN -HCT) 320-25 MG tablet Take 1 tablet by mouth daily. 90 tablet 3   glucose blood test strip Use as instructed. Check blood glucose level by fingerstick twice per day. 100 each 2   Insulin  Glargine (BASAGLAR  KWIKPEN) 100 UNIT/ML Inject 25 Units into the skin 2 (two) times daily. 15 mL 3   metFORMIN  (GLUCOPHAGE ) 1000 MG tablet Take 1 tablet (1,000 mg total) by mouth 2 (two) times daily with a meal. 360 tablet 1   rosuvastatin  (CRESTOR ) 5 MG tablet Take 1 tablet (5 mg total) by mouth daily. 90 tablet 1   Semaglutide , 2 MG/DOSE, (OZEMPIC , 2 MG/DOSE,) 8 MG/3ML SOPN Inject 2 mg as directed once a week. 9 mL 1   No facility-administered medications prior to visit.    No Known Allergies     Objective:    BP 110/76 (BP Location: Left Arm, Patient Position: Sitting, Cuff Size: Normal)   Pulse 69   Resp 19   Ht 5' 9 (1.753 m)   Wt 206 lb 6.4 oz (93.6 kg)   SpO2 100%   BMI 30.48 kg/m  Wt Readings from Last 3 Encounters:  09/29/23 206 lb 6.4 oz (93.6 kg)  05/31/23 211 lb 12.8 oz (96.1 kg)  03/22/23 211 lb (95.7 kg)    Physical Exam Vitals and nursing note reviewed.  Constitutional:      Appearance: He is well-developed.  HENT:     Head: Normocephalic and atraumatic.  Cardiovascular:     Rate and Rhythm: Normal rate and regular rhythm.     Heart sounds: Normal heart sounds. No murmur heard.    No friction rub. No gallop.  Pulmonary:     Effort: Pulmonary effort is normal. No tachypnea or respiratory distress.     Breath sounds: Normal breath sounds. No decreased breath sounds, wheezing, rhonchi or rales.  Chest:     Chest wall: No tenderness.  Musculoskeletal:        General: Normal range of motion.      Cervical back: Normal range of motion.  Skin:    General: Skin is warm and dry.  Neurological:     Mental Status: He is alert and oriented to person, place, and time.     Coordination: Coordination normal.  Psychiatric:        Behavior: Behavior normal. Behavior is cooperative.        Thought Content: Thought content normal.        Judgment: Judgment normal.          Patient has been counseled extensively about nutrition and exercise as well as the importance of  adherence with medications and regular follow-up. The patient was given clear instructions to go to ER or return to medical center if symptoms don't improve, worsen or new problems develop. The patient verbalized understanding.   Follow-up: Return in about 14 weeks (around 01/05/2024).   Haze LELON Servant, FNP-BC [facility] and [facility] [place], [place] [phone]   10/18/2023, 7:07 AM

## 2023-09-29 NOTE — Patient Instructions (Addendum)
 Aumentar la insulina en 2 unidades cada 3 das para una glucemia en ayunas inferior a 90 das.

## 2023-09-30 ENCOUNTER — Other Ambulatory Visit: Payer: Self-pay

## 2023-09-30 ENCOUNTER — Ambulatory Visit: Payer: Self-pay | Admitting: Nurse Practitioner

## 2023-09-30 ENCOUNTER — Other Ambulatory Visit (HOSPITAL_COMMUNITY): Payer: Self-pay

## 2023-09-30 LAB — CMP14+EGFR
ALT: 31 IU/L (ref 0–44)
AST: 27 IU/L (ref 0–40)
Albumin: 4.9 g/dL (ref 3.8–4.9)
Alkaline Phosphatase: 81 IU/L (ref 44–121)
BUN/Creatinine Ratio: 16 (ref 9–20)
BUN: 15 mg/dL (ref 6–24)
Bilirubin Total: 0.4 mg/dL (ref 0.0–1.2)
CO2: 23 mmol/L (ref 20–29)
Calcium: 9.5 mg/dL (ref 8.7–10.2)
Chloride: 100 mmol/L (ref 96–106)
Creatinine, Ser: 0.93 mg/dL (ref 0.76–1.27)
Globulin, Total: 2.2 g/dL (ref 1.5–4.5)
Glucose: 124 mg/dL — ABNORMAL HIGH (ref 70–99)
Potassium: 4.1 mmol/L (ref 3.5–5.2)
Sodium: 141 mmol/L (ref 134–144)
Total Protein: 7.1 g/dL (ref 6.0–8.5)
eGFR: 99 mL/min/1.73

## 2023-10-08 ENCOUNTER — Other Ambulatory Visit: Payer: Self-pay

## 2023-10-12 ENCOUNTER — Other Ambulatory Visit: Payer: Self-pay

## 2023-10-18 ENCOUNTER — Encounter: Payer: Self-pay | Admitting: Nurse Practitioner

## 2023-10-25 ENCOUNTER — Other Ambulatory Visit: Payer: Self-pay | Admitting: Nurse Practitioner

## 2023-10-25 ENCOUNTER — Other Ambulatory Visit: Payer: Self-pay

## 2023-10-25 DIAGNOSIS — I10 Essential (primary) hypertension: Secondary | ICD-10-CM

## 2023-10-26 ENCOUNTER — Other Ambulatory Visit: Payer: Self-pay

## 2023-10-26 MED ORDER — VALSARTAN-HYDROCHLOROTHIAZIDE 320-25 MG PO TABS
1.0000 | ORAL_TABLET | Freq: Every day | ORAL | 3 refills | Status: AC
  Filled 2023-10-26: qty 90, 90d supply, fill #0
  Filled 2024-01-31: qty 90, 90d supply, fill #1

## 2023-10-27 ENCOUNTER — Other Ambulatory Visit: Payer: Self-pay

## 2023-11-08 ENCOUNTER — Other Ambulatory Visit: Payer: Self-pay

## 2023-11-15 ENCOUNTER — Other Ambulatory Visit: Payer: Self-pay

## 2023-11-16 ENCOUNTER — Other Ambulatory Visit: Payer: Self-pay

## 2023-11-23 ENCOUNTER — Other Ambulatory Visit: Payer: Self-pay

## 2023-11-23 ENCOUNTER — Other Ambulatory Visit (HOSPITAL_COMMUNITY): Payer: Self-pay

## 2023-11-24 ENCOUNTER — Other Ambulatory Visit: Payer: Self-pay

## 2023-12-02 ENCOUNTER — Other Ambulatory Visit: Payer: Self-pay

## 2023-12-03 ENCOUNTER — Other Ambulatory Visit: Payer: Self-pay

## 2023-12-15 ENCOUNTER — Other Ambulatory Visit: Payer: Self-pay

## 2023-12-15 ENCOUNTER — Other Ambulatory Visit: Payer: Self-pay | Admitting: Nurse Practitioner

## 2023-12-15 MED ORDER — DAPAGLIFLOZIN PROPANEDIOL 10 MG PO TABS
10.0000 mg | ORAL_TABLET | Freq: Every day | ORAL | 2 refills | Status: DC
  Filled 2023-12-15 – 2023-12-16 (×2): qty 30, 30d supply, fill #0

## 2023-12-16 ENCOUNTER — Other Ambulatory Visit: Payer: Self-pay

## 2023-12-20 ENCOUNTER — Other Ambulatory Visit: Payer: Self-pay

## 2023-12-24 ENCOUNTER — Other Ambulatory Visit: Payer: Self-pay

## 2023-12-24 ENCOUNTER — Other Ambulatory Visit: Payer: Self-pay | Admitting: Pharmacist

## 2023-12-24 MED ORDER — DAPAGLIFLOZIN PROPANEDIOL 10 MG PO TABS: 10.0000 mg | ORAL_TABLET | Freq: Every day | ORAL | 2 refills | Status: AC

## 2023-12-31 ENCOUNTER — Other Ambulatory Visit: Payer: Self-pay

## 2024-01-03 ENCOUNTER — Other Ambulatory Visit: Payer: Self-pay

## 2024-01-03 ENCOUNTER — Other Ambulatory Visit: Payer: Self-pay | Admitting: Pharmacist

## 2024-01-03 MED ORDER — DAPAGLIFLOZIN PROPANEDIOL 10 MG PO TABS
10.0000 mg | ORAL_TABLET | Freq: Every day | ORAL | 0 refills | Status: DC
  Filled 2024-01-03: qty 14, 14d supply, fill #0

## 2024-01-12 ENCOUNTER — Telehealth: Payer: Self-pay | Admitting: Nurse Practitioner

## 2024-01-12 NOTE — Telephone Encounter (Signed)
 Pt confirmed appt 12/3 (per vr )

## 2024-01-14 ENCOUNTER — Encounter: Payer: Self-pay | Admitting: Nurse Practitioner

## 2024-01-14 ENCOUNTER — Other Ambulatory Visit: Payer: Self-pay

## 2024-01-14 ENCOUNTER — Ambulatory Visit: Payer: Self-pay | Attending: Nurse Practitioner | Admitting: Nurse Practitioner

## 2024-01-14 VITALS — BP 112/75 | HR 73 | Resp 19 | Ht 69.0 in | Wt 207.8 lb

## 2024-01-14 DIAGNOSIS — Z794 Long term (current) use of insulin: Secondary | ICD-10-CM

## 2024-01-14 DIAGNOSIS — Z713 Dietary counseling and surveillance: Secondary | ICD-10-CM

## 2024-01-14 DIAGNOSIS — R051 Acute cough: Secondary | ICD-10-CM

## 2024-01-14 DIAGNOSIS — E119 Type 2 diabetes mellitus without complications: Secondary | ICD-10-CM

## 2024-01-14 DIAGNOSIS — Z7984 Long term (current) use of oral hypoglycemic drugs: Secondary | ICD-10-CM

## 2024-01-14 DIAGNOSIS — Z7182 Exercise counseling: Secondary | ICD-10-CM

## 2024-01-14 MED ORDER — BENZONATATE 200 MG PO CAPS
200.0000 mg | ORAL_CAPSULE | Freq: Two times a day (BID) | ORAL | 0 refills | Status: AC | PRN
  Filled 2024-01-14: qty 30, 15d supply, fill #0

## 2024-01-14 MED ORDER — DAPAGLIFLOZIN PROPANEDIOL 10 MG PO TABS
10.0000 mg | ORAL_TABLET | Freq: Every day | ORAL | 1 refills | Status: AC
  Filled 2024-01-14: qty 90, 90d supply, fill #0

## 2024-01-14 MED ORDER — GLUCOSE BLOOD VI STRP
ORAL_STRIP | 2 refills | Status: AC
  Filled 2024-01-14: qty 100, 50d supply, fill #0

## 2024-01-14 NOTE — Progress Notes (Signed)
 Assessment & Plan:  Hurley was seen today for hypertension.  Diagnoses and all orders for this visit:  Diabetes mellitus treated with insulin  and oral medication (HCC) -     Urine Albumin/Creatinine with ratio (send out) [LAB689] -     dapagliflozin  propanediol (FARXIGA ) 10 MG TABS tablet; Take 1 tablet (10 mg total) by mouth daily. -     glucose blood test strip; Use as instructed. Check blood glucose level by fingerstick twice per day.  Acute cough -     benzonatate  (TESSALON ) 200 MG capsule; Take 1 capsule (200 mg total) by mouth 2 (two) times daily as needed for cough.    Patient has been counseled on age-appropriate routine health concerns for screening and prevention. These are reviewed and up-to-date. Referrals have been placed accordingly. Immunizations are up-to-date or declined.    Subjective:   Chief Complaint  Patient presents with   Hypertension    Lance Howell 53 y.o. male presents to office today for follow up to HTN and with acute cough   HTN Blood pressure at goal with valsartan  320-25 mg daily and amlodipine  5 mg daily BP Readings from Last 3 Encounters:  01/14/24 112/75  09/29/23 110/76  05/31/23 116/81   DM 2  A1c at goal of less than 7 Lab Results  Component Value Date   HGBA1C 6.7 09/29/2023     Cough: Patient complains of nonproductive cough.  Symptoms began several days ago.  The cough is non-productive, without wheezing, dyspnea or hemoptysis and is aggravated by nothing Associated symptoms include:none. Patient does not have new pets. Patient does not have a history of asthma. Patient does have a history of environmental allergens but has not been taking zyrtec . Patient has not had recent travel. Patient does not have a history of smoking.  He has been taking dextomethorpan with no relief and the OTC medication also makes him jittery  Review of Systems  Constitutional:  Negative for fever, malaise/fatigue and weight loss.  HENT: Negative.   Negative for nosebleeds.   Eyes: Negative.  Negative for blurred vision, double vision and photophobia.  Respiratory:  Positive for cough. Negative for shortness of breath.   Cardiovascular: Negative.  Negative for chest pain, palpitations and leg swelling.  Gastrointestinal: Negative.  Negative for heartburn, nausea and vomiting.  Musculoskeletal: Negative.  Negative for myalgias.  Neurological: Negative.  Negative for dizziness, focal weakness, seizures and headaches.  Psychiatric/Behavioral: Negative.  Negative for suicidal ideas.     Past Medical History:  Diagnosis Date   Diabetes mellitus without complication (HCC)    Hyperlipidemia    Hypertension     Past Surgical History:  Procedure Laterality Date   APPENDECTOMY     kidney stone removal      Family History  Problem Relation Age of Onset   Hypertension Mother    Diabetes Father     Social History Reviewed with no changes to be made today.   Outpatient Medications Prior to Visit  Medication Sig Dispense Refill   amLODipine  (NORVASC ) 5 MG tablet Take 1 tablet (5 mg total) by mouth daily. 90 tablet 2   Blood Glucose Monitoring Suppl (TRUE METRIX METER) w/Device KIT Use as instructed. 1 kit 0   cetirizine  (ZYRTEC ) 10 MG tablet Take 1 tablet (10 mg total) by mouth daily. FOR ALLERGIES 90 tablet 1   famotidine  (PEPCID ) 40 MG tablet Take 1 tablet (40 mg total) by mouth daily. 90 tablet 1   Insulin  Glargine (BASAGLAR  KWIKPEN) 100  UNIT/ML Inject 25 Units into the skin 2 (two) times daily. 15 mL 3   Insulin  Pen Needle (PEN NEEDLES) 32G X 4 MM MISC Use to inject Basaglar  once daily and Humalog  twice daily. 100 each 6   meloxicam  (MOBIC ) 7.5 MG tablet Take 1-2 tablets (7.5-15 mg total) by mouth daily. 60 tablet 0   metFORMIN  (GLUCOPHAGE ) 1000 MG tablet Take 1 tablet (1,000 mg total) by mouth 2 (two) times daily with a meal. 360 tablet 1   rosuvastatin  (CRESTOR ) 5 MG tablet Take 1 tablet (5 mg total) by mouth daily. 90 tablet 1    Semaglutide , 2 MG/DOSE, (OZEMPIC , 2 MG/DOSE,) 8 MG/3ML SOPN Inject 2 mg as directed once a week. 9 mL 1   TRUEplus Lancets 28G MISC Use as instructed. Check blood glucose level by fingerstick twice per day. 100 each 2   valsartan -hydrochlorothiazide  (DIOVAN -HCT) 320-25 MG tablet Take 1 tablet by mouth daily. 90 tablet 3   glucose blood test strip Use as instructed. Check blood glucose level by fingerstick twice per day. 100 each 2   dapagliflozin  propanediol (FARXIGA ) 10 MG TABS tablet Take 1 tablet (10 mg total) by mouth daily before breakfast. (Patient not taking: Reported on 01/14/2024) 90 tablet 2   dapagliflozin  propanediol (FARXIGA ) 10 MG TABS tablet Take 1 tablet (10 mg total) by mouth daily. (Patient not taking: Reported on 01/14/2024) 14 tablet 0   No facility-administered medications prior to visit.    No Known Allergies     Objective:    BP 112/75 (BP Location: Left Arm, Patient Position: Sitting, Cuff Size: Normal)   Pulse 73   Resp 19   Ht 5' 9 (1.753 m)   Wt 207 lb 12.8 oz (94.3 kg)   SpO2 97%   BMI 30.69 kg/m  Wt Readings from Last 3 Encounters:  01/14/24 207 lb 12.8 oz (94.3 kg)  09/29/23 206 lb 6.4 oz (93.6 kg)  05/31/23 211 lb 12.8 oz (96.1 kg)    Physical Exam Vitals and nursing note reviewed.  Constitutional:      Appearance: He is well-developed.  HENT:     Head: Normocephalic and atraumatic.  Cardiovascular:     Rate and Rhythm: Normal rate and regular rhythm.     Heart sounds: Normal heart sounds. No murmur heard.    No friction rub. No gallop.  Pulmonary:     Effort: Pulmonary effort is normal. No tachypnea or respiratory distress.     Breath sounds: Normal breath sounds. No decreased breath sounds, wheezing, rhonchi or rales.  Chest:     Chest wall: No tenderness.  Musculoskeletal:        General: Normal range of motion.     Cervical back: Normal range of motion.  Skin:    General: Skin is warm and dry.  Neurological:     Mental Status: He  is alert and oriented to person, place, and time.     Coordination: Coordination normal.  Psychiatric:        Behavior: Behavior normal. Behavior is cooperative.        Thought Content: Thought content normal.        Judgment: Judgment normal.          Patient has been counseled extensively about nutrition and exercise as well as the importance of adherence with medications and regular follow-up. The patient was given clear instructions to go to ER or return to medical center if symptoms don't improve, worsen or new problems develop. The patient verbalized  understanding.   Follow-up: Return in about 14 weeks (around 04/21/2024).   Haze LELON Servant, FNP-BC [facility] [place], [place] [phone]   01/14/2024, 12:43 PM

## 2024-01-16 ENCOUNTER — Ambulatory Visit: Payer: Self-pay | Admitting: Nurse Practitioner

## 2024-01-16 LAB — MICROALBUMIN / CREATININE URINE RATIO
Creatinine, Urine: 97 mg/dL
Microalb/Creat Ratio: 11 mg/g{creat} (ref 0–29)
Microalbumin, Urine: 11.1 ug/mL

## 2024-01-27 ENCOUNTER — Other Ambulatory Visit: Payer: Self-pay

## 2024-01-31 ENCOUNTER — Other Ambulatory Visit: Payer: Self-pay

## 2024-02-02 ENCOUNTER — Other Ambulatory Visit: Payer: Self-pay

## 2024-02-24 ENCOUNTER — Other Ambulatory Visit: Payer: Self-pay

## 2024-03-01 ENCOUNTER — Other Ambulatory Visit: Payer: Self-pay

## 2024-03-10 ENCOUNTER — Other Ambulatory Visit: Payer: Self-pay

## 2024-03-13 ENCOUNTER — Other Ambulatory Visit: Payer: Self-pay | Admitting: Nurse Practitioner

## 2024-03-13 ENCOUNTER — Other Ambulatory Visit: Payer: Self-pay

## 2024-03-13 DIAGNOSIS — I10 Essential (primary) hypertension: Secondary | ICD-10-CM

## 2024-03-13 DIAGNOSIS — E119 Type 2 diabetes mellitus without complications: Secondary | ICD-10-CM

## 2024-03-13 MED ORDER — AMLODIPINE BESYLATE 5 MG PO TABS
5.0000 mg | ORAL_TABLET | Freq: Every day | ORAL | 0 refills | Status: AC
  Filled 2024-03-13: qty 90, 90d supply, fill #0

## 2024-03-13 MED ORDER — BASAGLAR KWIKPEN 100 UNIT/ML ~~LOC~~ SOPN
25.0000 [IU] | PEN_INJECTOR | Freq: Two times a day (BID) | SUBCUTANEOUS | 0 refills | Status: AC
  Filled 2024-03-13: qty 15, 30d supply, fill #0

## 2024-04-21 ENCOUNTER — Ambulatory Visit: Payer: Self-pay | Admitting: Nurse Practitioner
